# Patient Record
Sex: Male | Born: 1960 | Race: Asian | Hispanic: No | Marital: Married | Smoking: Never smoker
Health system: Southern US, Community
[De-identification: ages and names within clinical notes are randomized; demographics above are authoritative.]

## PROBLEM LIST (undated history)

## (undated) ENCOUNTER — Emergency Department (HOSPITAL_COMMUNITY): Admission: EM | Payer: No Typology Code available for payment source | Source: Home / Self Care

## (undated) DIAGNOSIS — M4800 Spinal stenosis, site unspecified: Secondary | ICD-10-CM

## (undated) DIAGNOSIS — M549 Dorsalgia, unspecified: Secondary | ICD-10-CM

## (undated) DIAGNOSIS — Z87442 Personal history of urinary calculi: Secondary | ICD-10-CM

## (undated) HISTORY — PX: BACK SURGERY: SHX140

## (undated) HISTORY — PX: WRIST FRACTURE SURGERY: SHX121

---

## 2009-06-09 ENCOUNTER — Encounter: Admission: RE | Admit: 2009-06-09 | Discharge: 2009-07-14 | Payer: Self-pay | Admitting: Orthopedic Surgery

## 2009-08-23 ENCOUNTER — Inpatient Hospital Stay (HOSPITAL_COMMUNITY): Admission: RE | Admit: 2009-08-23 | Discharge: 2009-08-24 | Payer: Self-pay | Admitting: Orthopedic Surgery

## 2009-09-12 ENCOUNTER — Encounter
Admission: RE | Admit: 2009-09-12 | Discharge: 2009-10-31 | Payer: Self-pay | Source: Home / Self Care | Admitting: Orthopedic Surgery

## 2010-04-23 LAB — CBC
HCT: 46.4 % (ref 39.0–52.0)
MCH: 29.6 pg (ref 26.0–34.0)
MCV: 86.6 fL (ref 78.0–100.0)
Platelets: 170 10*3/uL (ref 150–400)
RBC: 5.36 MIL/uL (ref 4.22–5.81)

## 2010-04-23 LAB — URINALYSIS, ROUTINE W REFLEX MICROSCOPIC
Glucose, UA: NEGATIVE mg/dL
Ketones, ur: NEGATIVE mg/dL
Protein, ur: 30 mg/dL — AB
Urobilinogen, UA: 0.2 mg/dL (ref 0.0–1.0)
pH: 6 (ref 5.0–8.0)

## 2010-04-23 LAB — SURGICAL PCR SCREEN
MRSA, PCR: NEGATIVE
Staphylococcus aureus: NEGATIVE

## 2010-04-23 LAB — COMPREHENSIVE METABOLIC PANEL
ALT: 28 U/L (ref 0–53)
AST: 27 U/L (ref 0–37)
Alkaline Phosphatase: 99 U/L (ref 39–117)
CO2: 26 mEq/L (ref 19–32)
Chloride: 110 mEq/L (ref 96–112)
GFR calc non Af Amer: >60 mL/min (ref >60)
Glucose, Bld: 108 mg/dL — ABNORMAL HIGH (ref 70–99)
Potassium: 3.5 mEq/L (ref 3.5–5.1)
Total Bilirubin: 0.5 mg/dL (ref 0.3–1.2)

## 2010-04-23 LAB — DIFFERENTIAL
Basophils Relative: 1 % (ref 0–1)
Eosinophils Relative: 2 % (ref 0–5)
Monocytes Absolute: 0.3 10*3/uL (ref 0.1–1.0)
Neutro Abs: 2.6 10*3/uL (ref 1.7–7.7)
Neutrophils Relative %: 57 % (ref 43–77)

## 2010-04-23 LAB — APTT: aPTT: 29 seconds (ref 24–37)

## 2010-04-23 LAB — TYPE AND SCREEN
ABO/RH(D): B POS
Antibody Screen: NEGATIVE

## 2010-04-23 LAB — URINE MICROSCOPIC-ADD ON

## 2013-07-15 ENCOUNTER — Other Ambulatory Visit: Payer: Self-pay | Admitting: Orthopedic Surgery

## 2013-07-15 DIAGNOSIS — M48061 Spinal stenosis, lumbar region without neurogenic claudication: Secondary | ICD-10-CM

## 2013-07-20 ENCOUNTER — Other Ambulatory Visit: Payer: Self-pay | Admitting: Orthopedic Surgery

## 2013-07-20 ENCOUNTER — Ambulatory Visit
Admission: RE | Admit: 2013-07-20 | Discharge: 2013-07-20 | Disposition: A | Discharge status: Home / Self Care | Payer: No Typology Code available for payment source | Source: Ambulatory Visit | Attending: Orthopedic Surgery | Admitting: Orthopedic Surgery

## 2013-07-20 ENCOUNTER — Inpatient Hospital Stay
Admission: RE | Admit: 2013-07-20 | Discharge: 2013-07-20 | Disposition: A | Discharge status: Home / Self Care | Payer: Self-pay | Source: Ambulatory Visit | Attending: Orthopedic Surgery | Admitting: Orthopedic Surgery

## 2013-07-20 VITALS — BP 127/73 | HR 55

## 2013-07-20 DIAGNOSIS — M48061 Spinal stenosis, lumbar region without neurogenic claudication: Secondary | ICD-10-CM

## 2013-07-20 MED ORDER — DIAZEPAM 5 MG PO TABS
5.0000 mg | ORAL_TABLET | Freq: Once | ORAL | Status: AC
  Administered 2013-07-20: 5 mg via ORAL

## 2013-07-20 MED ORDER — MEPERIDINE HCL 100 MG/ML IJ SOLN
50.0000 mg | Freq: Once | INTRAMUSCULAR | Status: AC
  Administered 2013-07-20: 50 mg via INTRAMUSCULAR

## 2013-07-20 MED ORDER — IOHEXOL 180 MG/ML  SOLN
17.0000 mL | Freq: Once | INTRAMUSCULAR | Status: AC | PRN
  Administered 2013-07-20: 17 mL via INTRATHECAL

## 2013-07-20 MED ORDER — ONDANSETRON HCL 4 MG/2ML IJ SOLN
4.0000 mg | Freq: Once | INTRAMUSCULAR | Status: AC
  Administered 2013-07-20: 4 mg via INTRAMUSCULAR

## 2013-07-20 NOTE — Discharge Instructions (Signed)

## 2013-07-23 ENCOUNTER — Other Ambulatory Visit: Payer: Self-pay | Admitting: Urology

## 2013-07-24 ENCOUNTER — Encounter (HOSPITAL_COMMUNITY)
Admission: RE | Admit: 2013-07-24 | Discharge: 2013-07-24 | Disposition: A | Discharge status: Home / Self Care | Payer: No Typology Code available for payment source | Source: Ambulatory Visit | Attending: Urology | Admitting: Urology

## 2013-07-24 ENCOUNTER — Encounter (HOSPITAL_COMMUNITY): Payer: Self-pay

## 2013-07-24 ENCOUNTER — Encounter (HOSPITAL_COMMUNITY): Payer: Self-pay | Admitting: Pharmacy Technician

## 2013-07-24 DIAGNOSIS — Z01812 Encounter for preprocedural laboratory examination: Secondary | ICD-10-CM | POA: Insufficient documentation

## 2013-07-24 HISTORY — DX: Dorsalgia, unspecified: M54.9

## 2013-07-24 HISTORY — DX: Personal history of urinary calculi: Z87.442

## 2013-07-24 LAB — BASIC METABOLIC PANEL
BUN: 15 mg/dL (ref 6–23)
CO2: 23 meq/L (ref 19–32)
Calcium: 9.4 mg/dL (ref 8.4–10.5)
Chloride: 103 mEq/L (ref 96–112)
Creatinine, Ser: 1.12 mg/dL (ref 0.50–1.35)
GFR calc Af Amer: 86 mL/min — ABNORMAL LOW (ref >90)
GFR calc non Af Amer: 74 mL/min — ABNORMAL LOW (ref >90)
Glucose, Bld: 181 mg/dL — ABNORMAL HIGH (ref 70–99)
POTASSIUM: 4.1 meq/L (ref 3.7–5.3)
Sodium: 140 mEq/L (ref 137–147)

## 2013-07-24 LAB — CBC
HCT: 47.8 % (ref 39.0–52.0)
Hemoglobin: 16.4 g/dL (ref 13.0–17.0)
MCH: 28.5 pg (ref 26.0–34.0)
MCHC: 34.3 g/dL (ref 30.0–36.0)
MCV: 83 fL (ref 78.0–100.0)
PLATELETS: 196 10*3/uL (ref 150–400)
RBC: 5.76 MIL/uL (ref 4.22–5.81)
RDW: 12.8 % (ref 11.5–15.5)
WBC: 4.9 10*3/uL (ref 4.0–10.5)

## 2013-07-24 NOTE — Pre-Procedure Instructions (Addendum)
07-24-13 CT lumbar spine 07-20-13 Epic. Pt. Signed for daughter Juluis Rainierhao [place] to act as interpreter.

## 2013-07-24 NOTE — Patient Instructions (Signed)
20 Gregory Dickson  07/24/2013   Your procedure is scheduled on:   07-29-2013  [facility] and follow signs to Short Stay Center. Arrive at    0900    AM / PM.  Call this number if you have problems the morning of surgery: [phone]  Or Presurgical Testing [phone]([place]) For Living Will and/or Health Care Power Attorney Forms: please provide copy for your medical record,may bring AM of surgery(Forms should be already notarized -we do not provide this service). 07-24-13 No information preferred today).  Remember: Follow any bowel prep instructions per MD office.   Do not eat food:After Midnight.    Take these medicines the morning of surgery with A SIP OF WATER: NONE.   Do not wear jewelry, make-up or nail polish.  Do not wear lotions, powders, or perfumes. You may wear deodorant.  Do not shave 48 hours(2 days) prior to first CHG shower(legs and under arms).(Shaving face and neck okay.)  Do not bring valuables to the hospital.(Hospital is not responsible for lost valuables).  Contacts, dentures or removable bridgework, body piercing, hair pins may not be worn into surgery.  Leave suitcase in the car. After surgery it may be brought to your room.  For patients admitted to the hospital, checkout time is 11:00 AM the day of discharge.(Restricted visitors-Any Persons displaying flu-like symptoms or illness).    Patients discharged the day of surgery will not be allowed to drive home. Must have responsible person with you x 24 hours once discharged.  Name and phone number of your driver: Juluis Rainierhao Chenette, daughter to interpret -[phone] cell  Special Instructions: CHG(Chlorhedine 4%-"Hibiclens","Betasept","Aplicare") Shower Use Special Wash: see special instructions.(avoid face and genitals)      ________________________    [facility] - Preparing for [facility] Before surgery, you can play an important role.  Because skin is not sterile, your skin needs to be as  free of germs as possible.  You can reduce the number of germs on your skin by washing with CHG (chlorahexidine gluconate) soap before surgery.  CHG is an antiseptic cleaner which kills germs and bonds with the skin to continue killing germs even after washing. Please DO NOT use if you have an allergy to CHG or antibacterial soaps.  If your skin becomes reddened/irritated stop using the CHG and inform your nurse when you arrive at Short Stay. Do not shave (including legs and underarms) for at least 48 hours prior to the first CHG shower.  You may shave your face/neck. Please follow these instructions carefully:  1.  Shower with CHG Soap the night before surgery and the  morning of Surgery.  2.  If you choose to wash your hair, wash your hair first as usual with your  normal  shampoo.  3.  After you shampoo, rinse your hair and body thoroughly to remove the  shampoo.                           4.  Use CHG as you would any other liquid soap.  You can apply chg directly  to the skin and wash                       Gently with a scrungie or clean washcloth.  5.  Apply the CHG Soap to your body ONLY FROM THE NECK DOWN.   Do not use on face/ open  Wound or open sores. Avoid contact with eyes, ears mouth and genitals (private parts).                       Wash face,  Genitals (private parts) with your normal soap.             6.  Wash thoroughly, paying special attention to the area where your surgery  will be performed.  7.  Thoroughly rinse your body with warm water from the neck down.  8.  DO NOT shower/wash with your normal soap after using and rinsing off  the CHG Soap.                9.  Pat yourself dry with a clean towel.            10.  Wear clean pajamas.            11.  Place clean sheets on your bed the night of your first shower and do not  sleep with pets. Day of Surgery : Do not apply any lotions/deodorants the morning of surgery.  Please wear clean clothes to the  hospital/surgery center.  FAILURE TO FOLLOW THESE INSTRUCTIONS MAY RESULT IN THE CANCELLATION OF YOUR SURGERY PATIENT SIGNATURE_________________________________  NURSE SIGNATURE__________________________________  ________________________________________________________________________

## 2013-07-29 ENCOUNTER — Ambulatory Visit (HOSPITAL_COMMUNITY): Payer: No Typology Code available for payment source | Admitting: Certified Registered Nurse Anesthetist

## 2013-07-29 ENCOUNTER — Encounter (HOSPITAL_COMMUNITY): Payer: Self-pay | Admitting: *Deleted

## 2013-07-29 ENCOUNTER — Other Ambulatory Visit: Payer: Self-pay | Admitting: Urology

## 2013-07-29 ENCOUNTER — Ambulatory Visit (HOSPITAL_COMMUNITY)
Admission: RE | Admit: 2013-07-29 | Discharge: 2013-07-29 | Disposition: A | Discharge status: Home / Self Care | Payer: No Typology Code available for payment source | Source: Ambulatory Visit | Attending: Urology | Admitting: Urology

## 2013-07-29 ENCOUNTER — Ambulatory Visit (HOSPITAL_COMMUNITY): Payer: No Typology Code available for payment source

## 2013-07-29 ENCOUNTER — Encounter (HOSPITAL_COMMUNITY): Payer: No Typology Code available for payment source | Admitting: Certified Registered Nurse Anesthetist

## 2013-07-29 ENCOUNTER — Encounter (HOSPITAL_COMMUNITY)
Admission: RE | Disposition: A | Discharge status: Home / Self Care | Payer: Self-pay | Source: Ambulatory Visit | Attending: Urology

## 2013-07-29 DIAGNOSIS — N135 Crossing vessel and stricture of ureter without hydronephrosis: Secondary | ICD-10-CM | POA: Insufficient documentation

## 2013-07-29 DIAGNOSIS — Z79899 Other long term (current) drug therapy: Secondary | ICD-10-CM | POA: Insufficient documentation

## 2013-07-29 DIAGNOSIS — N201 Calculus of ureter: Secondary | ICD-10-CM

## 2013-07-29 HISTORY — PX: CYSTOSCOPY WITH RETROGRADE PYELOGRAM, URETEROSCOPY AND STENT PLACEMENT: SHX5789

## 2013-07-29 SURGERY — CYSTOURETEROSCOPY, WITH RETROGRADE PYELOGRAM AND STENT INSERTION
Anesthesia: General | Laterality: Right

## 2013-07-29 MED ORDER — PHENAZOPYRIDINE HCL 200 MG PO TABS
200.0000 mg | ORAL_TABLET | Freq: Once | ORAL | Status: AC
Start: 2013-07-29 — End: 2013-07-29
  Administered 2013-07-29: 200 mg via ORAL
  Filled 2013-07-29: qty 1

## 2013-07-29 MED ORDER — BELLADONNA ALKALOIDS-OPIUM 16.2-60 MG RE SUPP
RECTAL | Status: DC | PRN
  Administered 2013-07-29: 1 via RECTAL

## 2013-07-29 MED ORDER — SENNOSIDES-DOCUSATE SODIUM 8.6-50 MG PO TABS: 1.0000 | ORAL_TABLET | Freq: Two times a day (BID) | ORAL | Status: DC

## 2013-07-29 MED ORDER — PHENAZOPYRIDINE HCL 200 MG PO TABS: 200.0000 mg | ORAL_TABLET | Freq: Three times a day (TID) | ORAL | Status: DC | PRN

## 2013-07-29 MED ORDER — ONDANSETRON HCL 4 MG/2ML IJ SOLN
INTRAMUSCULAR | Status: AC
  Filled 2013-07-29: qty 2

## 2013-07-29 MED ORDER — OXYCODONE-ACETAMINOPHEN 5-325 MG PO TABS: 1.0000 | ORAL_TABLET | ORAL | Status: DC | PRN

## 2013-07-29 MED ORDER — LIDOCAINE HCL (CARDIAC) 20 MG/ML IV SOLN
INTRAVENOUS | Status: DC | PRN
  Administered 2013-07-29: 100 mg via INTRAVENOUS

## 2013-07-29 MED ORDER — LIDOCAINE HCL 2 % EX GEL
CUTANEOUS | Status: AC
  Filled 2013-07-29: qty 10

## 2013-07-29 MED ORDER — PROMETHAZINE HCL 25 MG/ML IJ SOLN: 6.2500 mg | INTRAMUSCULAR | Status: DC | PRN

## 2013-07-29 MED ORDER — FENTANYL CITRATE 0.05 MG/ML IJ SOLN
INTRAMUSCULAR | Status: AC
  Filled 2013-07-29: qty 2

## 2013-07-29 MED ORDER — ONDANSETRON HCL 4 MG/2ML IJ SOLN
INTRAMUSCULAR | Status: DC | PRN
  Administered 2013-07-29: 4 mg via INTRAVENOUS

## 2013-07-29 MED ORDER — LIDOCAINE HCL 2 % EX GEL
CUTANEOUS | Status: DC | PRN
  Administered 2013-07-29: 1 via URETHRAL

## 2013-07-29 MED ORDER — IOHEXOL 350 MG/ML SOLN
INTRAVENOUS | Status: DC | PRN
  Administered 2013-07-29: 24 mL via URETHRAL

## 2013-07-29 MED ORDER — FENTANYL CITRATE 0.05 MG/ML IJ SOLN
25.0000 ug | INTRAMUSCULAR | Status: DC | PRN
  Administered 2013-07-29 (×2): 50 ug via INTRAVENOUS

## 2013-07-29 MED ORDER — CIPROFLOXACIN HCL 500 MG PO TABS: 500.0000 mg | ORAL_TABLET | Freq: Two times a day (BID) | ORAL | Status: DC

## 2013-07-29 MED ORDER — LACTATED RINGERS IV SOLN
INTRAVENOUS | Status: DC
  Administered 2013-07-29: 13:00:00 via INTRAVENOUS
  Administered 2013-07-29: 1000 mL via INTRAVENOUS

## 2013-07-29 MED ORDER — MIDAZOLAM HCL 2 MG/2ML IJ SOLN
INTRAMUSCULAR | Status: AC
  Filled 2013-07-29: qty 2

## 2013-07-29 MED ORDER — SODIUM CHLORIDE 0.9 % IR SOLN
Status: DC | PRN
  Administered 2013-07-29: 4000 mL

## 2013-07-29 MED ORDER — BELLADONNA ALKALOIDS-OPIUM 16.2-60 MG RE SUPP
RECTAL | Status: AC
  Filled 2013-07-29: qty 1

## 2013-07-29 MED ORDER — MIDAZOLAM HCL 5 MG/5ML IJ SOLN
INTRAMUSCULAR | Status: DC | PRN
  Administered 2013-07-29: 2 mg via INTRAVENOUS

## 2013-07-29 MED ORDER — PROPOFOL 10 MG/ML IV BOLUS
INTRAVENOUS | Status: AC
  Filled 2013-07-29: qty 20

## 2013-07-29 MED ORDER — PROPOFOL 10 MG/ML IV BOLUS
INTRAVENOUS | Status: DC | PRN
  Administered 2013-07-29: 50 mg via INTRAVENOUS
  Administered 2013-07-29: 150 mg via INTRAVENOUS

## 2013-07-29 MED ORDER — CEFAZOLIN SODIUM-DEXTROSE 2-3 GM-% IV SOLR
2.0000 g | INTRAVENOUS | Status: AC
  Administered 2013-07-29: 2 g via INTRAVENOUS

## 2013-07-29 MED ORDER — TAMSULOSIN HCL 0.4 MG PO CAPS: 0.4000 mg | ORAL_CAPSULE | Freq: Every day | ORAL | Status: DC

## 2013-07-29 MED ORDER — FENTANYL CITRATE 0.05 MG/ML IJ SOLN
INTRAMUSCULAR | Status: DC | PRN
  Administered 2013-07-29 (×2): 25 ug via INTRAVENOUS

## 2013-07-29 MED ORDER — CEFAZOLIN SODIUM-DEXTROSE 2-3 GM-% IV SOLR
INTRAVENOUS | Status: AC
  Filled 2013-07-29: qty 50

## 2013-07-29 MED ORDER — LIDOCAINE HCL (CARDIAC) 20 MG/ML IV SOLN
INTRAVENOUS | Status: AC
  Filled 2013-07-29: qty 5

## 2013-07-29 SURGICAL SUPPLY — 29 items
BAG URO CATCHER STRL LF (DRAPE) ×3 IMPLANT
BASKET LASER NITINOL 1.9FR (BASKET) IMPLANT
BASKET STNLS GEMINI 4WIRE 3FR (BASKET) IMPLANT
BASKET ZERO TIP NITINOL 2.4FR (BASKET) IMPLANT
CATH CLEAR GEL 3F BACKSTOP (CATHETERS) IMPLANT
CATH URET 5FR 28IN CONE TIP (BALLOONS)
CATH URET 5FR 28IN OPEN ENDED (CATHETERS) ×3 IMPLANT
CATH URET 5FR 70CM CONE TIP (BALLOONS) IMPLANT
CATH URET DUAL LUMEN 6-10FR 50 (CATHETERS) ×3 IMPLANT
CLOTH BEACON ORANGE TIMEOUT ST (SAFETY) ×3 IMPLANT
DRAPE CAMERA CLOSED 9X96 (DRAPES) ×3 IMPLANT
FIBER LASER FLEXIVA 200 (UROLOGICAL SUPPLIES) IMPLANT
FIBER LASER FLEXIVA 365 (UROLOGICAL SUPPLIES) IMPLANT
GLOVE BIOGEL M 7.0 STRL (GLOVE) ×9 IMPLANT
GOWN STRL REUS W/TWL LRG LVL3 (GOWN DISPOSABLE) ×6 IMPLANT
GOWN STRL REUS W/TWL XL LVL3 (GOWN DISPOSABLE) IMPLANT
GUIDEWIRE ANG ZIPWIRE 038X150 (WIRE) IMPLANT
GUIDEWIRE STR DUAL SENSOR (WIRE) ×3 IMPLANT
IV NS IRRIG 3000ML ARTHROMATIC (IV SOLUTION) ×3 IMPLANT
MANIFOLD NEPTUNE II (INSTRUMENTS) ×3 IMPLANT
PACK CYSTO (CUSTOM PROCEDURE TRAY) ×3 IMPLANT
SCRUB PCMX 4 OZ (MISCELLANEOUS) ×3 IMPLANT
SHEATH ACCESS URETERAL 38CM (SHEATH) IMPLANT
SHEATH URET ACCESS 12FR/35CM (UROLOGICAL SUPPLIES) IMPLANT
SHEATH URET ACCESS 12FR/55CM (UROLOGICAL SUPPLIES) IMPLANT
STENT CONTOUR 6FRX24X.038 (STENTS) ×3 IMPLANT
SYRINGE IRR TOOMEY STRL 70CC (SYRINGE) IMPLANT
TUBING CONNECTING 10 (TUBING) ×2 IMPLANT
TUBING CONNECTING 10' (TUBING) ×1

## 2013-07-29 NOTE — Anesthesia Preprocedure Evaluation (Signed)
Anesthesia Evaluation  Patient identified by MRN, date of birth, ID band Patient awake    Reviewed: Allergy & Precautions, H&P , NPO status , Patient's Chart, lab work & pertinent test results  Airway Mallampati: II TM Distance: >3 FB Neck ROM: Full    Dental no notable dental hx.    Pulmonary neg pulmonary ROS,  breath sounds clear to auscultation  Pulmonary exam normal       Cardiovascular Exercise Tolerance: Good negative cardio ROS  Rhythm:Regular Rate:Normal     Neuro/Psych negative neurological ROS  negative psych ROS   GI/Hepatic negative GI ROS, Neg liver ROS,   Endo/Other  negative endocrine ROS  Renal/GU negative Renal ROS  negative genitourinary   Musculoskeletal negative musculoskeletal ROS (+)   Abdominal   Peds negative pediatric ROS (+)  Hematology negative hematology ROS (+)   Anesthesia Other Findings   Reproductive/Obstetrics negative OB ROS                           Anesthesia Physical Anesthesia Plan  ASA: I  Anesthesia Plan: General   Post-op Pain Management:    Induction: Intravenous  Airway Management Planned: LMA  Additional Equipment:   Intra-op Plan:   Post-operative Plan: Extubation in OR  Informed Consent: I have reviewed the patients History and Physical, chart, labs and discussed the procedure including the risks, benefits and alternatives for the proposed anesthesia with the patient or authorized representative who has indicated his/her understanding and acceptance.   Dental advisory given  Plan Discussed with: CRNA  Anesthesia Plan Comments:         Anesthesia Quick Evaluation  

## 2013-07-29 NOTE — Transfer of Care (Signed)
Immediate Anesthesia Transfer of Care Note  Patient: Gregory Dickson  Procedure(s) Performed: Procedure(s) (LRB): CYSTOSCOPY WITH RETROGRADE PYELOGRAM, URETEROSCOPY AND STENT PLACEMENT (Right)  Patient Location: PACU  Anesthesia Type: General  Level of Consciousness: sedated, patient cooperative and responds to stimulation  Airway & Oxygen Therapy: Patient Spontanous Breathing and Patient connected to face mask oxgen  Post-op Assessment: Report given to PACU RN and Post -op Vital signs reviewed and stable  Post vital signs: Reviewed and stable  Complications: No apparent anesthesia complications

## 2013-07-29 NOTE — Discharge Instructions (Signed)
DISCHARGE INSTRUCTIONS FOR KIDNEY STONES OR URETERAL STENT  MEDICATIONS:   1. Resume all your other meds from home - except do not take NSAIDS (naproxyn).  ACTIVITY 1. No strenuous activity x 1week 2. No driving while on narcotic pain medications 3. Drink plenty of water 4. Continue to walk at home - you can still get blood clots when you are at home, so keep active, but don't over do it. 5. May return to work in 3 days.  BATHING 1. You can shower or take a bath.     SIGNS/SYMPTOMS TO CALL: 1. Please call us if you have a fever greater than 101.5, uncontrolled  nausea/vomiting, uncontrolled pain, dizziness, unable to urinate, chest pain, shortness of breath, leg swelling, leg pain, redness around wound, drainage from wound, or any other concerns or questions.  You can reach us at [phone].

## 2013-07-29 NOTE — Anesthesia Postprocedure Evaluation (Signed)
  Anesthesia Post-op Note  Patient: Gregory Dickson  Procedure(s) Performed: Procedure(s) (LRB): CYSTOSCOPY WITH RETROGRADE PYELOGRAM, URETEROSCOPY AND STENT PLACEMENT (Right)  Patient Location: PACU  Anesthesia Type: General  Level of Consciousness: awake and alert   Airway and Oxygen Therapy: Patient Spontanous Breathing  Post-op Pain: mild  Post-op Assessment: Post-op Vital signs reviewed, Patient's Cardiovascular Status Stable, Respiratory Function Stable, Patent Airway and No signs of Nausea or vomiting  Last Vitals:  Filed Vitals:   07/29/13 1442  BP: 120/82  Pulse: 58  Temp: 36.6 C  Resp: 14    Post-op Vital Signs: stable   Complications: No apparent anesthesia complications

## 2013-07-29 NOTE — H&P (Signed)
Urology History and Physical Exam  CC: Right ureter stone.  HPI:  53 year old male presents today for right ureter stone.  This was discovered during workup for gross hematuria.  Workup revealed no malignancy.  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  It is unknown whether obstruction from his right ureter stone resulted in this or whether this was present before the stone developed.  This appears to be a long-standing issue due to the amount of atrophy.  There is no associated right-sided hydronephrosis.  The stone is located in the distal ureter.  It is 8 mm in size.  It is not associated with flank pain, but is associated with gross hematuria.  We have discussed management options including observation, but the patient would prefer to have intervention.  He presents today for cystoscopy, right ureteroscopy, laser lithotripsy, right retrograde program, and possible right ureter stent placement.  We discussed risks, benefits, alternatives, and likelihood of achieving goals.  Urine culture 07/24/13 was negative for growth.  PMH: Past Medical History  Diagnosis Date  . History of kidney stones     multiple  . Back pain     past hx. back surgery, recent Xray done 07-20-13    PSH: Past Surgical History  Procedure Laterality Date  . Back surgery      Lumbar -6 yrs ago  . Cystoscopy w/ ureteral stent placement    . Wrist fracture surgery      retained hardware    Allergies: No Known Allergies  Medications: Prescriptions prior to admission  Medication Sig Dispense Refill  . naproxen sodium (ANAPROX) 220 MG tablet Take 220 mg by mouth 2 (two) times daily with a meal.         Social History: History   Social History  . Marital Status: Married    Spouse Name: N/A    Number of Children: N/A  . Years of Education: N/A   Occupational History  . Not on file.   Social History Main Topics  . Smoking status: Never Smoker   . Smokeless tobacco: Never Used  . Alcohol Use:  No  . Drug Use: No  . Sexual Activity: Yes   Other Topics Concern  . Not on file   Social History Narrative  . No narrative on file    Family History: No family history on file.  Review of Systems: Positive: None (no gross hematuria since our last visit). Negative: Fever, SOB, or chest pain.  A further 10 point review of systems was negative except what is listed in the HPI.  Physical Exam: Filed Vitals:   07/29/13 0835  BP: 140/97  Pulse: 64  Temp: 97.5 F (36.4 C)  Resp: 18    General: No acute distress.  Awake. Head:  Normocephalic.  Atraumatic. ENT:  EOMI.  Mucous membranes moist Neck:  Supple.  No lymphadenopathy. CV:  S1 present. S2 present. Regular rate. Pulmonary: Equal effort bilaterally.  Clear to auscultation bilaterally. Abdomen: Soft.  Non- tender to palpation. Skin:  Normal turgor.  No visible rash. Extremity: No gross deformity of bilateral upper extremities.  No gross deformity of    bilateral lower extremities. Neurologic: Alert. Appropriate mood.  .  Studies:  No results found for this basename: HGB, WBC, PLT,  in the last 72 hours  No results found for this basename: NA, K, CL, CO2, BUN, CREATININE, CALCIUM, MAGNESIUM, GFRNONAA, GFRAA,  in the last 72 hours   No results found for this basename: PT,  INR, APTT,  in the last 72 hours   No components found with this basename: ABG,     Assessment:  Right ureter stone.  Plan: To OR  for cystoscopy, right ureteroscopy, laser lithotripsy, right retrograde program, and possible right ureter stent placement.

## 2013-07-29 NOTE — Op Note (Signed)
Urology Operative Report  Date of Procedure: 07/29/13  Surgeon: Rolan Bucco, MD Assistant:  None  Preoperative Diagnosis: Right ureter stone. Postoperative Diagnosis: Right ureter stone. Right distal ureter stenosis.  Procedure(s): Right ureteroscopy, diagnostic. Right retrograde pyelogram with interpretation. Right ureter stent placement (6 x 24 JJ, no tether). Cystoscopy.  Estimated blood loss: Minimal  Specimen: None  Drains: None  Complications: None  Findings: Filling defect in the distal ureter consistent with a stone. Extravasation from the proximal ureter with no instrumentation to explain why this would occur. Negative bladder tumors.  History of present illness: 53 year old male presents today for a right distal ureter stone. This was discovered during workup for gross hematuria. It was associated with an atrophic right kidney and therefore has likely been present for a long period of time. He elected to proceed with cystoscopy and ureteroscopy today understanding that this may require a staged ureteroscopy due to the duration the stone is been in place.   Procedure in detail: After informed consent was obtained, the patient was taken to the operating room. They were placed in the supine position. SCDs were turned on and in place. IV antibiotics were infused, and general anesthesia was induced. A timeout was performed in which the correct patient, surgical site, and procedure were identified and agreed upon by the team.  The patient was placed in a dorsolithotomy position, making sure to pad all pertinent neurovascular pressure points. The genitals were prepped and draped in the usual sterile fashion.  A rigid cystoscope was advanced through the urethra and into the bladder. The bladder was fully distended after was drained and evaluated in a systematic fashion to visualize the entire surface of the bladder. This was negative for bladder tumors.  Attention was  turned to the right ureter orifice. Due to the angle it could not be cannulated with a 5 French catheter and therefore I elected to try to place a sensor wire. This was not successful due to the ankle. I then placed an angle-tipped Glidewire which was able to be placed into the distal ureter. It met an obstruction in the distal ureter and with some manipulation was able to navigate this into the proximal ureter. I then passed a 5 [place] ureter catheter over this and removed the wire.  I obtained a right retrograde pyelogram by injecting 10 cc of Omnipaque through the 5 French ureter catheter. This revealed a filling defect in the left distal ureter and poor flow of contrast up the ureter itself. I then placed a sensor wire through the 5 French catheter up into the right renal pelvis. I attempted to place a 5 [place] ureter catheter over this without much success. I therefore left the wire place and placed a dual-lumen ureter access sheath over the wire into the distal ureter and injected 10 more cc of Omnipaque. This revealed a filling defect as described above and extravasation from the proximal ureter with good filling of the right renal pelvis. There was blunting of the calyces. This I did not drain well.  I do not have a good explanation why there were be extravasation of contrast in the mid ureter.  Secured to the safety wire to drape and pass a semirigid ureteroscope the urethra, and the bladder, and into the distal ureter. I encountered an area of stenosis beyond this I could see what appeared to be a stent. I removed the semirigid ureteroscope and attempted to place the dual-lumen ureter access sheath over the wire to dilate this  area, but this was not successful. I therefore elected to place a stent and return for a staged right ureteroscopy.  A 6 x 24 double-J stent was passed through the cystoscope, over the safety wire, and under fluoroscopy. This was difficult to pass beyond the distal stone.  This was deployed with the stent curling in the renal pelvis and a good curl in the bladder. There was return of contrast and evidence of drainage of contrast from the renal pelvis on fluoroscopy. The bladder was drained, the cystoscope was removed, I placed 10 cc of lidocaine jelly into the urethra, and a belladonna and opium suppository into the rectum.  Anesthesia was reversed after he's placed back in a supine position and he was taken to the [facility] in stable condition.  He'll be given Cipro to cover for 3 days now and to start 3 days prior to his staged right ureteroscopy.  All counts were correct at the end of the case.

## 2013-07-30 ENCOUNTER — Encounter (HOSPITAL_COMMUNITY): Payer: Self-pay | Admitting: Urology

## 2013-08-06 ENCOUNTER — Encounter (HOSPITAL_BASED_OUTPATIENT_CLINIC_OR_DEPARTMENT_OTHER): Payer: Self-pay | Admitting: *Deleted

## 2013-08-06 NOTE — Progress Notes (Signed)
Spoke with daughter Gweneth Fritterhao-will come on day of surgery to help interpret.Instructed to arrive at 0830-Npo after Mn-labs in epic.

## 2013-08-12 ENCOUNTER — Encounter (HOSPITAL_BASED_OUTPATIENT_CLINIC_OR_DEPARTMENT_OTHER)
Admission: RE | Disposition: A | Discharge status: Home / Self Care | Payer: Self-pay | Source: Ambulatory Visit | Attending: Urology

## 2013-08-12 ENCOUNTER — Ambulatory Visit (HOSPITAL_BASED_OUTPATIENT_CLINIC_OR_DEPARTMENT_OTHER)
Admission: RE | Admit: 2013-08-12 | Discharge: 2013-08-12 | Disposition: A | Discharge status: Home / Self Care | Payer: No Typology Code available for payment source | Source: Ambulatory Visit | Attending: Urology | Admitting: Urology

## 2013-08-12 ENCOUNTER — Encounter (HOSPITAL_BASED_OUTPATIENT_CLINIC_OR_DEPARTMENT_OTHER): Payer: No Typology Code available for payment source | Admitting: Anesthesiology

## 2013-08-12 ENCOUNTER — Encounter (HOSPITAL_BASED_OUTPATIENT_CLINIC_OR_DEPARTMENT_OTHER): Payer: Self-pay | Admitting: Anesthesiology

## 2013-08-12 ENCOUNTER — Ambulatory Visit (HOSPITAL_BASED_OUTPATIENT_CLINIC_OR_DEPARTMENT_OTHER): Payer: No Typology Code available for payment source | Admitting: Anesthesiology

## 2013-08-12 DIAGNOSIS — N201 Calculus of ureter: Secondary | ICD-10-CM | POA: Insufficient documentation

## 2013-08-12 DIAGNOSIS — Z79899 Other long term (current) drug therapy: Secondary | ICD-10-CM | POA: Insufficient documentation

## 2013-08-12 DIAGNOSIS — Z87442 Personal history of urinary calculi: Secondary | ICD-10-CM | POA: Insufficient documentation

## 2013-08-12 HISTORY — PX: HOLMIUM LASER APPLICATION: SHX5852

## 2013-08-12 HISTORY — PX: CYSTOSCOPY WITH RETROGRADE PYELOGRAM, URETEROSCOPY AND STENT PLACEMENT: SHX5789

## 2013-08-12 LAB — POCT I-STAT 4, (NA,K, GLUC, HGB,HCT)
GLUCOSE: 106 mg/dL — AB (ref 70–99)
HEMATOCRIT: 46 % (ref 39.0–52.0)
HEMOGLOBIN: 15.6 g/dL (ref 13.0–17.0)
Potassium: 4.1 mEq/L (ref 3.7–5.3)
Sodium: 140 mEq/L (ref 137–147)

## 2013-08-12 SURGERY — CYSTOURETEROSCOPY, WITH RETROGRADE PYELOGRAM AND STENT INSERTION
Anesthesia: General | Site: Ureter | Laterality: Right

## 2013-08-12 MED ORDER — PROPOFOL 10 MG/ML IV BOLUS
INTRAVENOUS | Status: DC | PRN
  Administered 2013-08-12: 200 mg via INTRAVENOUS

## 2013-08-12 MED ORDER — FENTANYL CITRATE 0.05 MG/ML IJ SOLN
INTRAMUSCULAR | Status: AC
  Filled 2013-08-12: qty 2

## 2013-08-12 MED ORDER — KETOROLAC TROMETHAMINE 30 MG/ML IJ SOLN
INTRAMUSCULAR | Status: DC | PRN
  Administered 2013-08-12: 30 mg via INTRAVENOUS

## 2013-08-12 MED ORDER — BELLADONNA ALKALOIDS-OPIUM 16.2-60 MG RE SUPP
RECTAL | Status: AC
  Filled 2013-08-12: qty 1

## 2013-08-12 MED ORDER — LACTATED RINGERS IV SOLN
INTRAVENOUS | Status: DC | PRN
  Administered 2013-08-12 (×2): via INTRAVENOUS

## 2013-08-12 MED ORDER — LACTATED RINGERS IV SOLN
INTRAVENOUS | Status: DC
  Administered 2013-08-12 (×2): via INTRAVENOUS
  Filled 2013-08-12: qty 1000

## 2013-08-12 MED ORDER — MIDAZOLAM HCL 5 MG/5ML IJ SOLN
INTRAMUSCULAR | Status: DC | PRN
  Administered 2013-08-12 (×2): 1 mg via INTRAVENOUS

## 2013-08-12 MED ORDER — SODIUM CHLORIDE 0.9 % IR SOLN
Status: DC | PRN
  Administered 2013-08-12: 4000 mL

## 2013-08-12 MED ORDER — FENTANYL CITRATE 0.05 MG/ML IJ SOLN
25.0000 ug | INTRAMUSCULAR | Status: DC | PRN
  Administered 2013-08-12: 50 ug via INTRAVENOUS
  Filled 2013-08-12: qty 1

## 2013-08-12 MED ORDER — ONDANSETRON HCL 4 MG/2ML IJ SOLN
INTRAMUSCULAR | Status: DC | PRN
  Administered 2013-08-12: 4 mg via INTRAVENOUS

## 2013-08-12 MED ORDER — CEFAZOLIN SODIUM-DEXTROSE 2-3 GM-% IV SOLR
2.0000 g | INTRAVENOUS | Status: AC
  Administered 2013-08-12: 2 g via INTRAVENOUS
  Filled 2013-08-12: qty 50

## 2013-08-12 MED ORDER — CIPROFLOXACIN HCL 500 MG PO TABS: 500.0000 mg | ORAL_TABLET | Freq: Two times a day (BID) | ORAL | Status: DC

## 2013-08-12 MED ORDER — FENTANYL CITRATE 0.05 MG/ML IJ SOLN
100.0000 ug | Freq: Once | INTRAMUSCULAR | Status: AC
  Administered 2013-08-12 (×2): 50 ug via INTRAVENOUS
  Filled 2013-08-12: qty 2

## 2013-08-12 MED ORDER — DEXAMETHASONE SODIUM PHOSPHATE 4 MG/ML IJ SOLN
INTRAMUSCULAR | Status: DC | PRN
  Administered 2013-08-12: 10 mg via INTRAVENOUS

## 2013-08-12 MED ORDER — PROMETHAZINE HCL 25 MG/ML IJ SOLN
6.2500 mg | INTRAMUSCULAR | Status: DC | PRN
Start: 2013-08-12 — End: 2013-08-12
  Filled 2013-08-12: qty 1

## 2013-08-12 MED ORDER — CEFAZOLIN SODIUM-DEXTROSE 2-3 GM-% IV SOLR
INTRAVENOUS | Status: AC
  Filled 2013-08-12: qty 50

## 2013-08-12 MED ORDER — ACETAMINOPHEN 10 MG/ML IV SOLN
INTRAVENOUS | Status: DC | PRN
  Administered 2013-08-12: 1000 mg via INTRAVENOUS

## 2013-08-12 MED ORDER — FENTANYL CITRATE 0.05 MG/ML IJ SOLN
INTRAMUSCULAR | Status: DC | PRN
  Administered 2013-08-12: 50 ug via INTRAVENOUS
  Administered 2013-08-12 (×2): 25 ug via INTRAVENOUS

## 2013-08-12 MED ORDER — OXYCODONE-ACETAMINOPHEN 5-325 MG PO TABS
1.0000 | ORAL_TABLET | ORAL | Status: DC | PRN
Start: 2013-08-12 — End: 2013-09-24

## 2013-08-12 MED ORDER — SUCCINYLCHOLINE CHLORIDE 20 MG/ML IJ SOLN
INTRAMUSCULAR | Status: DC | PRN
  Administered 2013-08-12: 20 mg via INTRAVENOUS

## 2013-08-12 MED ORDER — LIDOCAINE HCL (CARDIAC) 20 MG/ML IV SOLN
INTRAVENOUS | Status: DC | PRN
  Administered 2013-08-12: 80 mg via INTRAVENOUS

## 2013-08-12 MED ORDER — GLYCOPYRROLATE 0.2 MG/ML IJ SOLN
INTRAMUSCULAR | Status: DC | PRN
Start: 2013-08-12 — End: 2013-08-12
  Administered 2013-08-12: 0.2 mg via INTRAVENOUS

## 2013-08-12 MED ORDER — LIDOCAINE HCL 2 % EX GEL
CUTANEOUS | Status: DC | PRN
  Administered 2013-08-12: 1 via URETHRAL

## 2013-08-12 MED ORDER — MIDAZOLAM HCL 2 MG/2ML IJ SOLN
INTRAMUSCULAR | Status: AC
  Filled 2013-08-12: qty 2

## 2013-08-12 SURGICAL SUPPLY — 44 items
BAG DRAIN URO-CYSTO SKYTR STRL (DRAIN) ×3 IMPLANT
BASKET LASER NITINOL 1.9FR (BASKET) IMPLANT
BASKET STNLS GEMINI 4WIRE 3FR (BASKET) IMPLANT
BASKET STONE 1.7 NGAGE (UROLOGICAL SUPPLIES) ×3 IMPLANT
BASKET ZERO TIP NITINOL 2.4FR (BASKET) IMPLANT
CANISTER SUCT LVC 12 LTR MEDI- (MISCELLANEOUS) ×3 IMPLANT
CATH CLEAR GEL 3F BACKSTOP (CATHETERS) IMPLANT
CATH FOLEY 3WAY 30CC 22F (CATHETERS) ×3 IMPLANT
CATH INTERMIT  6FR 70CM (CATHETERS) IMPLANT
CATH URET 5FR 28IN CONE TIP (BALLOONS)
CATH URET 5FR 28IN OPEN ENDED (CATHETERS) ×3 IMPLANT
CATH URET 5FR 70CM CONE TIP (BALLOONS) IMPLANT
CATH URET DUAL LUMEN 6-10FR 50 (CATHETERS) ×3 IMPLANT
CLOTH BEACON ORANGE TIMEOUT ST (SAFETY) ×3 IMPLANT
DRAPE CAMERA CLOSED 9X96 (DRAPES) ×3 IMPLANT
ELECT REM PT RETURN 9FT ADLT (ELECTROSURGICAL)
ELECTRODE REM PT RTRN 9FT ADLT (ELECTROSURGICAL) IMPLANT
FIBER LASER FLEXIVA 200 (UROLOGICAL SUPPLIES) IMPLANT
FIBER LASER FLEXIVA 365 (UROLOGICAL SUPPLIES) IMPLANT
FIBER LASER TRAC TIP (UROLOGICAL SUPPLIES) ×3 IMPLANT
GLOVE BIO SURGEON STRL SZ 6 (GLOVE) ×3 IMPLANT
GLOVE BIO SURGEON STRL SZ7 (GLOVE) ×3 IMPLANT
GLOVE ECLIPSE 7.0 STRL STRAW (GLOVE) ×3 IMPLANT
GLOVE INDICATOR 6.5 STRL GRN (GLOVE) ×3 IMPLANT
GLOVE INDICATOR 7.5 STRL GRN (GLOVE) ×3 IMPLANT
GOWN PREVENTION PLUS LG XLONG (DISPOSABLE) IMPLANT
GOWN STRL REUS W/ TWL XL LVL3 (GOWN DISPOSABLE) ×1 IMPLANT
GOWN STRL REUS W/TWL XL LVL3 (GOWN DISPOSABLE) ×5 IMPLANT
GUIDEWIRE 0.038 PTFE COATED (WIRE) IMPLANT
GUIDEWIRE ANG ZIPWIRE 038X150 (WIRE) IMPLANT
GUIDEWIRE STR DUAL SENSOR (WIRE) ×3 IMPLANT
IV NS IRRIG 3000ML ARTHROMATIC (IV SOLUTION) ×3 IMPLANT
KIT BALLIN UROMAX 15FX10 (LABEL) IMPLANT
KIT BALLN UROMAX 15FX4 (MISCELLANEOUS) IMPLANT
KIT BALLN UROMAX 26 75X4 (MISCELLANEOUS)
PACK CYSTOSCOPY (CUSTOM PROCEDURE TRAY) ×3 IMPLANT
SET HIGH PRES BAL DIL (LABEL)
SHEATH ACCESS URETERAL 38CM (SHEATH) IMPLANT
SHEATH ACCESS URETERAL 54CM (SHEATH) IMPLANT
SHEATH URET ACCESS 12FR/35CM (UROLOGICAL SUPPLIES) IMPLANT
SHEATH URET ACCESS 12FR/55CM (UROLOGICAL SUPPLIES) IMPLANT
STENT ×3 IMPLANT
STENT PERCUFLEX 4.8FRX24 (STENTS) ×3 IMPLANT
SYRINGE IRR TOOMEY STRL 70CC (SYRINGE) IMPLANT

## 2013-08-12 NOTE — Discharge Instructions (Signed)
DISCHARGE INSTRUCTIONS FOR KIDNEY STONES OR URETERAL STENT ° °MEDICATIONS:  ° °1. Resume all your other meds from home. ° °ACTIVITY °1. No strenuous activity x 1week °2. No driving while on narcotic pain medications °3. Drink plenty of water °4. Continue to walk at home - you can still get blood clots when you are at home, so keep active, but don't over do it. °5. May return to work in 3 days. ° °BATHING °1. You can shower or a bath. ° ° ° °SIGNS/SYMPTOMS TO CALL: °1. Please call us if you have a fever greater than 101.5, uncontrolled  °nausea/vomiting, uncontrolled pain, dizziness, unable to urinate, chest pain, shortness of breath, leg swelling, leg pain, redness around wound, drainage from wound, or any other concerns or questions. ° °You can reach us at [phone]. ° ° °Post Anesthesia Home Care Instructions ° °Activity: °Get plenty of rest for the remainder of the day. A responsible adult should stay with you for 24 hours following the procedure.  °For the next 24 hours, DO NOT: °-Drive a car °-Operate machinery °-Drink alcoholic beverages °-Take any medication unless instructed by your physician °-Make any legal decisions or sign important papers. ° °Meals: °Start with liquid foods such as gelatin or soup. Progress to regular foods as tolerated. Avoid greasy, spicy, heavy foods. If nausea and/or vomiting occur, drink only clear liquids until the nausea and/or vomiting subsides. Call your physician if vomiting continues. ° °Special Instructions/Symptoms: °Your throat may feel dry or sore from the anesthesia or the breathing tube placed in your throat during surgery. If this causes discomfort, gargle with warm salt water. The discomfort should disappear within 24 hours. ° °

## 2013-08-12 NOTE — Anesthesia Preprocedure Evaluation (Signed)

## 2013-08-12 NOTE — Op Note (Signed)
Urology Operative Report  Date of Procedure: 08/12/13  Surgeon: Natalia Leatherwoodaniel Tayven Renteria, MD Assistant:  None  Preoperative Diagnosis: Right ureter stone. Postoperative Diagnosis:  Same  Procedure(s): Staged right ureteroscopy with laser lithotripsy, stone removal and right ureter stent placement (4.8 x 24JJ without tether). Right ureter stent removal. Cystoscopy.  Estimated blood loss: None  Specimen: Stones provided to the patient.  Drains: None  Complications: None  Findings: Large right ureter stones.  History of present illness: 53 year old male presents today for right staged ureteroscopy for obstructing ureter stones.   Procedure in detail: After informed consent was obtained, the patient was taken to the operating room. They were placed in the supine position. SCDs were turned on and in place. IV antibiotics were infused, and general anesthesia was induced. A timeout was performed in which the correct patient, surgical site, and procedure were identified and agreed upon by the team.  The patient was placed in a dorsolithotomy position, making sure to pad all pertinent neurovascular pressure points.  A belladonna and opium suppository was placed into the rectum. The genitals were prepped and draped in the usual sterile fashion.  A rigid cystoscope was advanced through the urethra and into the bladder. Attention was turned to the right ureter orifice. I placed a sensor wire alongside the right indwelling ureter stent up into the right renal pelvis. I then removed the stent with a stent grasper and secure the wires a safety wire.  The patient was then paralyzed and I navigated a semirigid ureteroscope through the urethra, into the bladder, and into the distal ureter with ease. I encountered a large ureter stone which actually appeared to be 2 large stones embedded in the wall of the right distal ureter. I performed lithotripsy with a 200  holmium laser fiber at settings of 0.5 J and  20 Hz. The stone was fragmented into smaller pieces which washed out of the ureter. The larger pieces were removed with a basket. I then navigated the rigid ureteroscope up to the proximal ureter and there were no further lesions or stones. I withdrew the ureter scope in the area where the stones had been embedded were inflamed as expected. I felt it would be best to leave a stent in place.  I withdrew the ureter scope and loaded a rigid cystoscope over the safety wire and navigated this to the urethra and into the bladder. A 4.8 x 24 double-J ureter stent without tether was placed over the wire through the cystoscope under fluoroscopy up into the right ureter with a good curl deployed in the right renal pelvis and a curl in the bladder.  The bladder was drained and the fragments were removed and provided to the patient.  He was placed back in a supine position, anesthesia was reversed, and [facility].  He will be given cipro to start the day before his stent removal in clinic.  All counts were correct at the end of the case.

## 2013-08-12 NOTE — Anesthesia Procedure Notes (Signed)
Procedure Name: LMA Insertion Date/Time: 08/12/2013 11:38 AM Performed by: Jessica PriestBEESON, Gregory Dickson Pre-anesthesia Checklist: Patient identified, Emergency Drugs available, Suction available and Patient being monitored Patient Re-evaluated:Patient Re-evaluated prior to inductionOxygen Delivery Method: Circle System Utilized Preoxygenation: Pre-oxygenation with 100% oxygen Intubation Type: IV induction Ventilation: Mask ventilation without difficulty LMA: LMA inserted LMA Size: 4.0 Number of attempts: 1 Airway Equipment and Method: bite block Placement Confirmation: positive ETCO2 Tube secured with: Tape Dental Injury: Teeth and Oropharynx as per pre-operative assessment

## 2013-08-12 NOTE — Transfer of Care (Signed)
Immediate Anesthesia Transfer of Care Note  Patient: Gregory Dickson  Procedure(s) Performed: Procedure(s) (LRB): CYSTOSCOPY , Right URETEROSCOPY AND Right STENT EXCHANGE, Stone extraction,  (Right) HOLMIUM LASER APPLICATION (Right)  Patient Location: PACU  Anesthesia Type: General  Level of Consciousness: awake, sedated, patient cooperative and responds to stimulation  Airway & Oxygen Therapy: Patient Spontanous Breathing and Patient connected to face mask oxygen  Post-op Assessment: Report given to PACU RN, Post -op Vital signs reviewed and stable and Patient moving all extremities  Post vital signs: Reviewed and stable  Complications: No apparent anesthesia complications

## 2013-08-12 NOTE — H&P (Signed)
Urology History and Physical Exam  CC: Right ureter stone.  HPI: 53 year old male presents today for right ureter stone. His daughter is with him today. This was discovered during workup of gross hematuria. The stone is located in the right distal ureter. It is 8 mm in size. It is obstructing. He has a right atrophic kidney. We do not know whether this is atrophic do to chronic obstruction from the stone or whether this was a congenital atrophy. Presented to the operating room 07/29/13 for ureteroscopy, but this was not successful do to stenosis of the ureter. He had a stent placed with plans to return for staged right ureteroscopy. We discussed management options along with risks and benefits, alternatives, and likelihood of achieving goals. He presents today for staged right ureteroscopy, cystoscopy, right retrograde pyelogram, laser lithotripsy, possible right ureter dilation, and right ureter stent exchange.  PMH: Past Medical History  Diagnosis Date  . History of kidney stones     multiple  . Back pain     past hx. back surgery, recent Xray done 07-20-13    PSH: Past Surgical History  Procedure Laterality Date  . Back surgery      Lumbar -6 yrs ago  . Wrist fracture surgery      retained hardware  . Cystoscopy with retrograde pyelogram, ureteroscopy and stent placement Right 07/29/2013    Procedure: CYSTOSCOPY WITH RETROGRADE PYELOGRAM, URETEROSCOPY AND STENT PLACEMENT;  Surgeon: Magdalene Mollyaniel Y Hiilei Gerst, MD;  Location: [facility];  Service: Urology;  Laterality: Right;    Allergies: No Known Allergies  Medications: Prescriptions prior to admission  Medication Sig Dispense Refill  . ciprofloxacin (CIPRO) 500 MG tablet Take 1 tablet (500 mg total) by mouth 2 (two) times daily. Take for 3 days, then start again 3 days before your surgery.  12 tablet  0  . oxyCODONE-acetaminophen (PERCOCET) 5-325 MG per tablet Take 1-2 tablets by mouth every 4 (four) hours as needed.  60 tablet  0  .  phenazopyridine (PYRIDIUM) 200 MG tablet Take 1 tablet (200 mg total) by mouth 3 (three) times daily as needed for pain.  30 tablet  6  . senna-docusate (SENOKOT S) 8.6-50 MG per tablet Take 1 tablet by mouth 2 (two) times daily.  60 tablet  0  . tamsulosin (FLOMAX) 0.4 MG CAPS capsule Take 1 capsule (0.4 mg total) by mouth at bedtime.  30 capsule  3     Social History: History   Social History  . Marital Status: Married    Spouse Name: N/A    Number of Children: N/A  . Years of Education: N/A   Occupational History  . Not on file.   Social History Main Topics  . Smoking status: Never Smoker   . Smokeless tobacco: Never Used  . Alcohol Use: No  . Drug Use: No  . Sexual Activity: Yes   Other Topics Concern  . Not on file   Social History Narrative  . No narrative on file    Family History: History reviewed. No pertinent family history.  Review of Systems: Positive: Right flank pain. Negative: Fever, SOB, or chest pain.  A further 10 point review of systems was negative except what is listed in the HPI.  Physical Exam: Filed Vitals:   08/12/13 0800  BP: 129/83  Pulse: 82  Temp: 97 F (36.1 C)  Resp: 16    General: No acute distress.  Awake. Head:  Normocephalic.  Atraumatic. ENT:  EOMI.  Mucous membranes  moist Neck:  Supple.  No lymphadenopathy. CV:  S1 present. S2 present. Regular rate. Pulmonary: Equal effort bilaterally.  Clear to auscultation bilaterally. Abdomen: Soft.  Non- tender to palpation. Skin:  Normal turgor.  No visible rash. Extremity: No gross deformity of bilateral upper extremities.  No gross deformity of    bilateral lower extremities. Neurologic: Alert. Appropriate mood.    Studies:  No results found for this basename: HGB, WBC, PLT,  in the last 72 hours  No results found for this basename: NA, K, CL, CO2, BUN, CREATININE, CALCIUM, MAGNESIUM, GFRNONAA, GFRAA,  in the last 72 hours   No results found for this basename: PT, INR,  APTT,  in the last 72 hours   No components found with this basename: ABG,     Assessment:  Right ureter stone.  Plan: To OR for staged right ureteroscopy, cystoscopy, right retrograde pyelogram, laser lithotripsy, possible right ureter dilation, and right ureter stent exchange.

## 2013-08-13 ENCOUNTER — Encounter (HOSPITAL_BASED_OUTPATIENT_CLINIC_OR_DEPARTMENT_OTHER): Payer: Self-pay | Admitting: Urology

## 2013-08-13 NOTE — Anesthesia Postprocedure Evaluation (Signed)
  Anesthesia Post-op Note  Patient: Gregory Dickson  Procedure(s) Performed: Procedure(s) (LRB): CYSTOSCOPY , Right URETEROSCOPY AND Right STENT EXCHANGE, Stone extraction,  (Right) HOLMIUM LASER APPLICATION (Right)  Patient Location: PACU  Anesthesia Type: General  Level of Consciousness: awake and alert   Airway and Oxygen Therapy: Patient Spontanous Breathing  Post-op Pain: mild  Post-op Assessment: Post-op Vital signs reviewed, Patient's Cardiovascular Status Stable, Respiratory Function Stable, Patent Airway and No signs of Nausea or vomiting  Last Vitals:  Filed Vitals:   08/12/13 1458  BP: 139/90  Pulse: 61  Temp: 36.1 C  Resp: 16    Post-op Vital Signs: stable   Complications: No apparent anesthesia complications

## 2013-09-02 ENCOUNTER — Other Ambulatory Visit: Payer: Self-pay | Admitting: Surgical

## 2013-09-14 ENCOUNTER — Encounter (HOSPITAL_COMMUNITY): Payer: Self-pay | Admitting: Pharmacy Technician

## 2013-09-15 NOTE — H&P (Signed)
Jhon Marty HeckDacat is an 53 y.o. male.   Chief Complaint: back pain HPI: Makye is a 53 year old male who presented with the chief complaint of low back pain with radiation of pain into his LE. He has a history of lumbar decompression L4-L5 with microdiscectomy at L4-L5 on the left nearly four years ago. He has had increased back pain over the past last couple months. No change in activity or injury. No changes in bladder or bowel function other than some issues with kidney stones which has required lithotripsy. He has not demonstrated any neurological deficits in the LE. MRI and CT myelogram of the lumbar spine shows lumbar spinal stenosis L3-L4 with disc herniation to the left at L4-L5.   Past Medical History  Diagnosis Date  . History of kidney stones     multiple  . Back pain     past hx. back surgery, recent Xray done 07-20-13    Past Surgical History  Procedure Laterality Date  . Back surgery      Lumbar -6 yrs ago  . Wrist fracture surgery      retained hardware  . Cystoscopy with retrograde pyelogram, ureteroscopy and stent placement Right 07/29/2013    Procedure: CYSTOSCOPY WITH RETROGRADE PYELOGRAM, URETEROSCOPY AND STENT PLACEMENT;  Surgeon: Magdalene Mollyaniel Y Woodruff, MD;  Location: [facility];  Service: Urology;  Laterality: Right;  . Cystoscopy with retrograde pyelogram, ureteroscopy and stent placement Right 08/12/2013    Procedure: CYSTOSCOPY , Right URETEROSCOPY AND Right STENT EXCHANGE, Stone extraction, ;  Surgeon: Magdalene Mollyaniel Y Woodruff, MD;  [facility];  Service: Urology;  Laterality: Right;  . Holmium laser application Right 08/12/2013    Procedure: HOLMIUM LASER APPLICATION;  Surgeon: Magdalene Mollyaniel Y Woodruff, MD;  [facility];  Service: Urology;  Laterality: Right;    Social History:  reports that he has never smoked. He has never used smokeless tobacco. He reports that he does not drink alcohol or use illicit drugs.  Allergies: No Known Allergies  No  current facility-administered medications for this encounter. Current outpatient prescriptions: oxyCODONE-acetaminophen (PERCOCET) 5-325 MG per tablet, Take 1-2 tablets by mouth every 4 (four) hours as needed., Disp: 40 tablet, Rfl: 0   Review of Systems  Constitutional: Positive for diaphoresis. Negative for fever, chills, weight loss and malaise/fatigue.  HENT: Negative for congestion, ear discharge, ear pain, hearing loss, nosebleeds, sore throat and tinnitus.   Eyes: Negative.   Respiratory: Negative.  Negative for stridor.   Cardiovascular: Negative.   Gastrointestinal: Negative.   Genitourinary: Negative.   Musculoskeletal: Positive for back pain and joint pain. Negative for falls and neck pain.  Skin: Negative.   Neurological: Positive for tingling and headaches. Negative for dizziness, tremors, sensory change, speech change, focal weakness, seizures, loss of consciousness and weakness.  Endo/Heme/Allergies: Positive for environmental allergies. Negative for polydipsia. Does not bruise/bleed easily.  Psychiatric/Behavioral: Positive for memory loss. Negative for depression, suicidal ideas, hallucinations and substance abuse. The patient is not nervous/anxious and does not have insomnia.    Vitals Weight: 146 lb Height: 64 in Body Surface Area: 1.73 m Body Mass Index: 25.06 kg/m Pulse: 58 (Regular) BP: 136/91 (Sitting, Left Arm, Standard)  Physical Exam  Constitutional: He is oriented to person, place, and time. He appears well-developed and well-nourished. No distress.  HENT:  Head: Normocephalic and atraumatic.  Right Ear: External ear normal.  Left Ear: External ear normal.  Nose: Nose normal.  Mouth/Throat: Oropharynx is clear and moist.  Eyes:  Conjunctivae and EOM are normal.  Neck: Normal range of motion. Neck supple.  Cardiovascular: Regular rhythm, normal heart sounds and intact distal pulses.  Bradycardia present.   No murmur heard. Respiratory: Effort  normal and breath sounds normal. No respiratory distress. He has no wheezes.  GI: Soft. Bowel sounds are normal. He exhibits no distension. There is no tenderness.  Musculoskeletal:       Right hip: Normal.       Left hip: Normal.       Right knee: Normal.       Left knee: Normal.       Lumbar back: He exhibits decreased range of motion, tenderness, pain and spasm. He exhibits no bony tenderness.       Right lower leg: He exhibits no tenderness and no swelling.       Left lower leg: He exhibits no tenderness and no swelling.       Right foot: Normal.       Left foot: Normal.  Positive SLR bilaterally but no neurological deficits in LE  Neurological: He is alert and oriented to person, place, and time. He has normal strength and normal reflexes. No sensory deficit.  Skin: No rash noted. He is not diaphoretic. No erythema.  Psychiatric: He has a normal mood and affect. His behavior is normal.     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 The possible complications of spinal surgery number one could be infection, which is extremely rare. We do use antibiotics prior to the surgery and during surgery and after surgery. Number two is always a slight degree of probability that you could develop a blood clot in your leg after any type of surgery and we try our best to prevent that with aspirin post op when it is safe to begin. The third is a dural leak. That is the spinal fluid leak that could occur. At certain rare times the bone or the disc could literally stick to the dura which is the lining which contains the spinal fluid and we could develop a small tear in that lining which we then patch up. That is an extremely rare complication. The last and final complication is a recurrent disc rupture. That means that you could rupture another small piece of  disc later on down the road and there is about a 2% chance of that.   Cato Liburd LAUREN 09/15/2013, 1:25 PM

## 2013-09-17 ENCOUNTER — Encounter (HOSPITAL_COMMUNITY)
Admission: RE | Admit: 2013-09-17 | Discharge: 2013-09-17 | Disposition: A | Discharge status: Home / Self Care | Payer: No Typology Code available for payment source | Source: Ambulatory Visit | Attending: Orthopedic Surgery | Admitting: Orthopedic Surgery

## 2013-09-17 ENCOUNTER — Encounter (HOSPITAL_COMMUNITY): Payer: Self-pay

## 2013-09-17 ENCOUNTER — Ambulatory Visit (HOSPITAL_COMMUNITY)
Admission: RE | Admit: 2013-09-17 | Discharge: 2013-09-17 | Disposition: A | Discharge status: Home / Self Care | Payer: No Typology Code available for payment source | Source: Ambulatory Visit | Attending: Surgical | Admitting: Surgical

## 2013-09-17 DIAGNOSIS — M48061 Spinal stenosis, lumbar region without neurogenic claudication: Secondary | ICD-10-CM | POA: Diagnosis not present

## 2013-09-17 DIAGNOSIS — Z01818 Encounter for other preprocedural examination: Secondary | ICD-10-CM | POA: Insufficient documentation

## 2013-09-17 DIAGNOSIS — M79609 Pain in unspecified limb: Secondary | ICD-10-CM | POA: Diagnosis not present

## 2013-09-17 DIAGNOSIS — M5126 Other intervertebral disc displacement, lumbar region: Secondary | ICD-10-CM | POA: Diagnosis not present

## 2013-09-17 HISTORY — DX: Spinal stenosis, site unspecified: M48.00

## 2013-09-17 LAB — COMPREHENSIVE METABOLIC PANEL
ALT: 25 U/L (ref 0–53)
AST: 29 U/L (ref 0–37)
Albumin: 4.1 g/dL (ref 3.5–5.2)
Alkaline Phosphatase: 109 U/L (ref 39–117)
Anion gap: 10 (ref 5–15)
BUN: 14 mg/dL (ref 6–23)
CO2: 29 mEq/L (ref 19–32)
Calcium: 10.6 mg/dL — ABNORMAL HIGH (ref 8.4–10.5)
Chloride: 102 mEq/L (ref 96–112)
Creatinine, Ser: 1.09 mg/dL (ref 0.50–1.35)
GFR calc Af Amer: 88 mL/min — ABNORMAL LOW (ref >90)
GFR calc non Af Amer: 76 mL/min — ABNORMAL LOW (ref >90)
Glucose, Bld: 78 mg/dL (ref 70–99)
Potassium: 4.8 mEq/L (ref 3.7–5.3)
Sodium: 141 mEq/L (ref 137–147)
Total Bilirubin: 0.3 mg/dL (ref 0.3–1.2)
Total Protein: 7.7 g/dL (ref 6.0–8.3)

## 2013-09-17 LAB — CBC
HCT: 46.3 % (ref 39.0–52.0)
HEMOGLOBIN: 15.5 g/dL (ref 13.0–17.0)
MCH: 28.2 pg (ref 26.0–34.0)
MCHC: 33.5 g/dL (ref 30.0–36.0)
MCV: 84.3 fL (ref 78.0–100.0)
Platelets: 188 10*3/uL (ref 150–400)
RBC: 5.49 MIL/uL (ref 4.22–5.81)
RDW: 13.1 % (ref 11.5–15.5)
WBC: 5.2 10*3/uL (ref 4.0–10.5)

## 2013-09-17 LAB — PROTIME-INR
INR: 0.93 (ref 0.00–1.49)
Prothrombin Time: 12.5 seconds (ref 11.6–15.2)

## 2013-09-17 LAB — URINALYSIS, ROUTINE W REFLEX MICROSCOPIC
Bilirubin Urine: NEGATIVE
Glucose, UA: NEGATIVE mg/dL
Hgb urine dipstick: NEGATIVE
Ketones, ur: NEGATIVE mg/dL
Leukocytes, UA: NEGATIVE
Nitrite: NEGATIVE
Protein, ur: NEGATIVE mg/dL
Specific Gravity, Urine: 1.019 (ref 1.005–1.030)
Urobilinogen, UA: 0.2 mg/dL (ref 0.0–1.0)
pH: 5.5 (ref 5.0–8.0)

## 2013-09-17 LAB — APTT: aPTT: 34 seconds (ref 24–37)

## 2013-09-17 LAB — SURGICAL PCR SCREEN
MRSA, PCR: POSITIVE — AB
STAPHYLOCOCCUS AUREUS: POSITIVE — AB

## 2013-09-17 NOTE — Patient Instructions (Addendum)
[name]  09/17/2013                           YOUR PROCEDURE IS SCHEDULED ON: 09/23/13               ENTER THRU [place] [facility]IN [facility] ENTRANCE AND                           FOLLOW  SIGNS TO SHORT STAY CENTER                 ARRIVE AT SHORT STAY AT: 6:30 AM               CALL THIS NUMBER IF ANY PROBLEMS THE DAY OF SURGERY :               [phone]                                REMEMBER:   Do not eat food or drink liquids AFTER MIDNIGHT                  Take these medicines the morning of surgery with               A SIPS OF WATER :   OXYCODONE IF NEEDED      Do not wear jewelry, make-up   Do not wear lotions, powders, or perfumes.   Do not shave legs or underarms 12 hrs. before surgery (men may shave face)  Do not bring valuables to the hospital.  Contacts, dentures or bridgework may not be worn into surgery.  Leave suitcase in the car. After surgery it may be brought to your room.  For patients admitted to the hospital more than one night, checkout time is            11:00 AM                                                       The day of discharge.   Patients discharged the day of surgery will not be allowed to drive home.            If going home same day of surgery, must have someone stay with you              FIRST 24 hrs at home and arrange for some one to drive you              home from hospital.   ________________________________________________________________________                                                                        [facility] - PREPARING FOR SURGERY  Before surgery, you can play an important role.  Because skin is not sterile, your skin needs to be as free of germs as possible.  You can reduce the number of germs on your skin by washing  with CHG (chlorahexidine gluconate) soap before surgery.  CHG is an antiseptic cleaner which kills germs and bonds with the skin to continue killing germs even after washing. Please DO  NOT use if you have an allergy to CHG or antibacterial soaps.  If your skin becomes reddened/irritated stop using the CHG and inform your nurse when you arrive at Short Stay. Do not shave (including legs and underarms) for at least 48 hours prior to the first CHG shower.  You may shave your face. Please follow these instructions carefully:   1.  Shower with CHG Soap the night before surgery and the  morning of Surgery.   2.  If you choose to wash your hair, wash your hair first as usual with your  normal  Shampoo.   3.  After you shampoo, rinse your hair and body thoroughly to remove the  shampoo.                                         4.  Use CHG as you would any other liquid soap.  You can apply chg directly  to the skin and wash . Gently wash with scrungie or clean wascloth    5.  Apply the CHG Soap to your body ONLY FROM THE NECK DOWN.   Do not use on open                           Wound or open sores. Avoid contact with eyes, ears mouth and genitals (private parts).                        Genitals (private parts) with your normal soap.              6.  Wash thoroughly, paying special attention to the area where your surgery  will be performed.   7.  Thoroughly rinse your body with warm water from the neck down.   8.  DO NOT shower/wash with your normal soap after using and rinsing off  the CHG Soap .                9.  Pat yourself dry with a clean towel.             10.  Wear clean pajamas.             11.  Place clean sheets on your bed the night of your first shower and do not  sleep with pets.  Day of Surgery : Do not apply any lotions/deodorants the morning of surgery.  Please wear clean clothes to the hospital/surgery center.  FAILURE TO FOLLOW THESE INSTRUCTIONS MAY RESULT IN THE CANCELLATION OF YOUR SURGERY    PATIENT SIGNATURE_________________________________  ______________________________________________________________________  Gregory Dickson  An  incentive spirometer is a tool that can help keep your lungs clear and active. This tool measures how well you are filling your lungs with each breath. Taking long deep breaths may help reverse or decrease the chance of developing breathing (pulmonary) problems (especially infection) following:  A long period of time when you are unable to move or be active. BEFORE THE PROCEDURE   If the spirometer includes an indicator to show your best effort, your nurse or respiratory therapist will set it to a desired goal.  If possible, sit up  straight or lean slightly forward. Try not to slouch.  Hold the incentive spirometer in an upright position. INSTRUCTIONS FOR USE  1. Sit on the edge of your bed if possible, or sit up as far as you can in bed or on a chair. 2. Hold the incentive spirometer in an upright position. 3. Breathe out normally. 4. Place the mouthpiece in your mouth and seal your lips tightly around it. 5. Breathe in slowly and as deeply as possible, raising the piston or the ball toward the top of the column. 6. Hold your breath for 3-5 seconds or for as long as possible. Allow the piston or ball to fall to the bottom of the column. 7. Remove the mouthpiece from your mouth and breathe out normally. 8. Rest for a few seconds and repeat Steps 1 through 7 at least 10 times every 1-2 hours when you are awake. Take your time and take a few normal breaths between deep breaths. 9. The spirometer may include an indicator to show your best effort. Use the indicator as a goal to work toward during each repetition. 10. After each set of 10 deep breaths, practice coughing to be sure your lungs are clear. If you have an incision (the cut made at the time of surgery), support your incision when coughing by placing a pillow or rolled up towels firmly against it. Once you are able to get out of bed, walk around indoors and cough well. You may stop using the incentive spirometer when instructed by your  caregiver.  RISKS AND COMPLICATIONS  Take your time so you do not get dizzy or light-headed.  If you are in pain, you may need to take or ask for pain medication before doing incentive spirometry. It is harder to take a deep breath if you are having pain. AFTER USE  Rest and breathe slowly and easily.  It can be helpful to keep track of a log of your progress. Your caregiver can provide you with a simple table to help with this. If you are using the spirometer at home, follow these instructions: SEEK MEDICAL CARE IF:   You are having difficultly using the spirometer.  You have trouble using the spirometer as often as instructed.  Your pain medication is not giving enough relief while using the spirometer.  You develop fever of 100.5 F (38.1 C) or higher. SEEK IMMEDIATE MEDICAL CARE IF:   You cough up bloody sputum that had not been present before.  You develop fever of 102 F (38.9 C) or greater.  You develop worsening pain at or near the incision site. MAKE SURE YOU:   Understand these instructions.  Will watch your condition.  Will get help right away if you are not doing well or get worse. Document Released: 06/04/2006 Document Revised: 04/16/2011 Document Reviewed: 08/05/2006 [facility] Patient Information 2014 [place], [place].   ________________________________________________________________________

## 2013-09-23 ENCOUNTER — Ambulatory Visit (HOSPITAL_COMMUNITY): Payer: No Typology Code available for payment source

## 2013-09-23 ENCOUNTER — Encounter (HOSPITAL_COMMUNITY)
Admission: RE | Disposition: A | Discharge status: Home / Self Care | Payer: Self-pay | Source: Ambulatory Visit | Attending: Orthopedic Surgery

## 2013-09-23 ENCOUNTER — Encounter (HOSPITAL_COMMUNITY): Payer: Self-pay | Admitting: *Deleted

## 2013-09-23 ENCOUNTER — Observation Stay (HOSPITAL_COMMUNITY)
Admission: RE | Admit: 2013-09-23 | Discharge: 2013-09-25 | Disposition: A | Discharge status: Home / Self Care | Payer: No Typology Code available for payment source | Source: Ambulatory Visit | Attending: Orthopedic Surgery | Admitting: Orthopedic Surgery

## 2013-09-23 ENCOUNTER — Encounter (HOSPITAL_COMMUNITY): Payer: No Typology Code available for payment source | Admitting: Anesthesiology

## 2013-09-23 ENCOUNTER — Ambulatory Visit (HOSPITAL_COMMUNITY): Payer: No Typology Code available for payment source | Admitting: Anesthesiology

## 2013-09-23 DIAGNOSIS — M79609 Pain in unspecified limb: Secondary | ICD-10-CM | POA: Insufficient documentation

## 2013-09-23 DIAGNOSIS — M48062 Spinal stenosis, lumbar region with neurogenic claudication: Secondary | ICD-10-CM

## 2013-09-23 DIAGNOSIS — M5126 Other intervertebral disc displacement, lumbar region: Secondary | ICD-10-CM | POA: Diagnosis not present

## 2013-09-23 DIAGNOSIS — M48061 Spinal stenosis, lumbar region without neurogenic claudication: Secondary | ICD-10-CM | POA: Insufficient documentation

## 2013-09-23 HISTORY — PX: LUMBAR LAMINECTOMY/DECOMPRESSION MICRODISCECTOMY: SHX5026

## 2013-09-23 LAB — TYPE AND SCREEN
ABO/RH(D): B POS
Antibody Screen: NEGATIVE

## 2013-09-23 SURGERY — LUMBAR LAMINECTOMY/DECOMPRESSION MICRODISCECTOMY 2 LEVELS
Anesthesia: General | Site: Back

## 2013-09-23 MED ORDER — SODIUM CHLORIDE 0.9 % IR SOLN
Status: DC | PRN
  Administered 2013-09-23: 10:00:00

## 2013-09-23 MED ORDER — SUCCINYLCHOLINE CHLORIDE 20 MG/ML IJ SOLN
INTRAMUSCULAR | Status: DC | PRN
  Administered 2013-09-23: 100 mg via INTRAVENOUS

## 2013-09-23 MED ORDER — HYDROMORPHONE HCL PF 1 MG/ML IJ SOLN
0.5000 mg | INTRAMUSCULAR | Status: DC | PRN
  Administered 2013-09-23 – 2013-09-24 (×3): 1 mg via INTRAVENOUS
  Administered 2013-09-25: 0.5 mg via INTRAVENOUS
  Filled 2013-09-23 (×5): qty 1

## 2013-09-23 MED ORDER — DEXAMETHASONE SODIUM PHOSPHATE 10 MG/ML IJ SOLN
INTRAMUSCULAR | Status: DC | PRN
  Administered 2013-09-23: 10 mg via INTRAVENOUS

## 2013-09-23 MED ORDER — BISACODYL 5 MG PO TBEC
5.0000 mg | DELAYED_RELEASE_TABLET | Freq: Every day | ORAL | Status: DC | PRN
Start: 2013-09-23 — End: 2013-09-25

## 2013-09-23 MED ORDER — METHOCARBAMOL 1000 MG/10ML IJ SOLN
500.0000 mg | Freq: Four times a day (QID) | INTRAVENOUS | Status: DC | PRN
  Administered 2013-09-23: 500 mg via INTRAVENOUS
  Filled 2013-09-23: qty 5

## 2013-09-23 MED ORDER — MIDAZOLAM HCL 5 MG/5ML IJ SOLN
INTRAMUSCULAR | Status: DC | PRN
  Administered 2013-09-23: 2 mg via INTRAVENOUS

## 2013-09-23 MED ORDER — VANCOMYCIN HCL 1000 MG IV SOLR
1000.0000 mg | INTRAVENOUS | Status: DC | PRN
  Administered 2013-09-23: 1000 mg via INTRAVENOUS

## 2013-09-23 MED ORDER — VANCOMYCIN HCL IN DEXTROSE 1-5 GM/200ML-% IV SOLN
INTRAVENOUS | Status: AC
  Filled 2013-09-23: qty 200

## 2013-09-23 MED ORDER — CEFAZOLIN SODIUM-DEXTROSE 2-3 GM-% IV SOLR
INTRAVENOUS | Status: AC
  Filled 2013-09-23: qty 50

## 2013-09-23 MED ORDER — FENTANYL CITRATE 0.05 MG/ML IJ SOLN
INTRAMUSCULAR | Status: DC | PRN
  Administered 2013-09-23 (×3): 50 ug via INTRAVENOUS

## 2013-09-23 MED ORDER — VANCOMYCIN HCL 1000 MG IV SOLR
1000.0000 mg | Freq: Two times a day (BID) | INTRAVENOUS | Status: DC
Start: 2013-09-23 — End: 2013-09-23

## 2013-09-23 MED ORDER — LIDOCAINE HCL (CARDIAC) 20 MG/ML IV SOLN
INTRAVENOUS | Status: AC
  Filled 2013-09-23: qty 5

## 2013-09-23 MED ORDER — VANCOMYCIN HCL IN DEXTROSE 1-5 GM/200ML-% IV SOLN
1000.0000 mg | Freq: Once | INTRAVENOUS | Status: AC
  Administered 2013-09-23: 1000 mg via INTRAVENOUS
  Filled 2013-09-23: qty 200

## 2013-09-23 MED ORDER — FENTANYL CITRATE 0.05 MG/ML IJ SOLN
INTRAMUSCULAR | Status: AC
  Filled 2013-09-23: qty 5

## 2013-09-23 MED ORDER — LACTATED RINGERS IV SOLN
INTRAVENOUS | Status: DC
  Administered 2013-09-23 (×2): via INTRAVENOUS

## 2013-09-23 MED ORDER — BUPIVACAINE-EPINEPHRINE (PF) 0.5% -1:200000 IJ SOLN
INTRAMUSCULAR | Status: AC
  Filled 2013-09-23: qty 30

## 2013-09-23 MED ORDER — PHENOL 1.4 % MT LIQD: 1.0000 | OROMUCOSAL | Status: DC | PRN

## 2013-09-23 MED ORDER — FLEET ENEMA 7-19 GM/118ML RE ENEM: 1.0000 | ENEMA | Freq: Once | RECTAL | Status: AC | PRN

## 2013-09-23 MED ORDER — BACITRACIN-NEOMYCIN-POLYMYXIN 400-5-5000 EX OINT
TOPICAL_OINTMENT | CUTANEOUS | Status: AC
  Filled 2013-09-23: qty 1

## 2013-09-23 MED ORDER — MIDAZOLAM HCL 2 MG/2ML IJ SOLN
INTRAMUSCULAR | Status: AC
  Filled 2013-09-23: qty 2

## 2013-09-23 MED ORDER — BUPIVACAINE LIPOSOME 1.3 % IJ SUSP
20.0000 mL | Freq: Once | INTRAMUSCULAR | Status: AC
  Administered 2013-09-23: 17 mL
  Filled 2013-09-23: qty 20

## 2013-09-23 MED ORDER — OXYCODONE-ACETAMINOPHEN 5-325 MG PO TABS
1.0000 | ORAL_TABLET | ORAL | Status: DC | PRN
  Administered 2013-09-23 – 2013-09-25 (×8): 2 via ORAL
  Filled 2013-09-23 (×3): qty 2
  Filled 2013-09-23 (×2): qty 1
  Filled 2013-09-23 (×4): qty 2

## 2013-09-23 MED ORDER — THROMBIN 5000 UNITS EX SOLR
CUTANEOUS | Status: AC
  Filled 2013-09-23: qty 10000

## 2013-09-23 MED ORDER — HYDROMORPHONE HCL PF 1 MG/ML IJ SOLN
INTRAMUSCULAR | Status: AC
  Filled 2013-09-23: qty 1

## 2013-09-23 MED ORDER — CEFAZOLIN SODIUM-DEXTROSE 2-3 GM-% IV SOLR
2.0000 g | INTRAVENOUS | Status: AC
  Administered 2013-09-23: 2 g via INTRAVENOUS

## 2013-09-23 MED ORDER — LACTATED RINGERS IV SOLN
INTRAVENOUS | Status: DC
  Administered 2013-09-23 – 2013-09-24 (×3): via INTRAVENOUS

## 2013-09-23 MED ORDER — PROPOFOL 10 MG/ML IV BOLUS
INTRAVENOUS | Status: DC | PRN
  Administered 2013-09-23: 160 mg via INTRAVENOUS

## 2013-09-23 MED ORDER — ONDANSETRON HCL 4 MG/2ML IJ SOLN
4.0000 mg | INTRAMUSCULAR | Status: DC | PRN
  Administered 2013-09-24: 4 mg via INTRAVENOUS
  Filled 2013-09-23: qty 2

## 2013-09-23 MED ORDER — PROMETHAZINE HCL 25 MG/ML IJ SOLN: 6.2500 mg | INTRAMUSCULAR | Status: DC | PRN

## 2013-09-23 MED ORDER — ONDANSETRON HCL 4 MG/2ML IJ SOLN
INTRAMUSCULAR | Status: DC | PRN
  Administered 2013-09-23: 4 mg via INTRAVENOUS

## 2013-09-23 MED ORDER — POLYETHYLENE GLYCOL 3350 17 G PO PACK: 17.0000 g | PACK | Freq: Every day | ORAL | Status: DC | PRN

## 2013-09-23 MED ORDER — ACETAMINOPHEN 325 MG PO TABS
650.0000 mg | ORAL_TABLET | ORAL | Status: DC | PRN
  Administered 2013-09-24: 650 mg via ORAL
  Filled 2013-09-23: qty 2

## 2013-09-23 MED ORDER — MENTHOL 3 MG MT LOZG
1.0000 | LOZENGE | OROMUCOSAL | Status: DC | PRN
  Filled 2013-09-23: qty 9

## 2013-09-23 MED ORDER — HYDROMORPHONE HCL PF 1 MG/ML IJ SOLN
0.2500 mg | INTRAMUSCULAR | Status: DC | PRN
  Administered 2013-09-23 (×4): 0.5 mg via INTRAVENOUS

## 2013-09-23 MED ORDER — ACETAMINOPHEN 10 MG/ML IV SOLN
1000.0000 mg | Freq: Once | INTRAVENOUS | Status: AC
  Administered 2013-09-23: 1000 mg via INTRAVENOUS
  Filled 2013-09-23: qty 100

## 2013-09-23 MED ORDER — HYDROMORPHONE HCL PF 1 MG/ML IJ SOLN
0.2500 mg | INTRAMUSCULAR | Status: DC | PRN
  Administered 2013-09-23 (×2): 0.5 mg via INTRAVENOUS

## 2013-09-23 MED ORDER — METHOCARBAMOL 500 MG PO TABS
500.0000 mg | ORAL_TABLET | Freq: Four times a day (QID) | ORAL | Status: DC | PRN
Start: 2013-09-23 — End: 2013-09-25
  Administered 2013-09-23 – 2013-09-25 (×4): 500 mg via ORAL
  Filled 2013-09-23 (×4): qty 1

## 2013-09-23 MED ORDER — HYDROCODONE-ACETAMINOPHEN 5-325 MG PO TABS: 1.0000 | ORAL_TABLET | ORAL | Status: DC | PRN

## 2013-09-23 MED ORDER — ROCURONIUM BROMIDE 100 MG/10ML IV SOLN
INTRAVENOUS | Status: DC | PRN
  Administered 2013-09-23: 40 mg via INTRAVENOUS

## 2013-09-23 MED ORDER — LIDOCAINE HCL (CARDIAC) 20 MG/ML IV SOLN
INTRAVENOUS | Status: DC | PRN
  Administered 2013-09-23: 100 mg via INTRAVENOUS

## 2013-09-23 MED ORDER — PROPOFOL 10 MG/ML IV BOLUS
INTRAVENOUS | Status: AC
  Filled 2013-09-23: qty 20

## 2013-09-23 MED ORDER — BUPIVACAINE-EPINEPHRINE 0.5% -1:200000 IJ SOLN
INTRAMUSCULAR | Status: DC | PRN
  Administered 2013-09-23: 17 mL

## 2013-09-23 MED ORDER — GLYCOPYRROLATE 0.2 MG/ML IJ SOLN
INTRAMUSCULAR | Status: DC | PRN
  Administered 2013-09-23: 0.4 mg via INTRAVENOUS

## 2013-09-23 MED ORDER — DOCUSATE SODIUM 100 MG PO CAPS
100.0000 mg | ORAL_CAPSULE | Freq: Two times a day (BID) | ORAL | Status: DC
  Administered 2013-09-23 – 2013-09-24 (×3): 100 mg via ORAL

## 2013-09-23 MED ORDER — ACETAMINOPHEN 650 MG RE SUPP: 650.0000 mg | RECTAL | Status: DC | PRN

## 2013-09-23 MED ORDER — NEOSTIGMINE METHYLSULFATE 10 MG/10ML IV SOLN
INTRAVENOUS | Status: DC | PRN
  Administered 2013-09-23: 3 mg via INTRAVENOUS

## 2013-09-23 MED ORDER — THROMBIN 5000 UNITS EX SOLR
CUTANEOUS | Status: DC | PRN
  Administered 2013-09-23: 10000 [IU] via TOPICAL

## 2013-09-23 SURGICAL SUPPLY — 43 items
BAG ZIPLOCK 12X15 (MISCELLANEOUS) ×3 IMPLANT
BENZOIN TINCTURE PRP APPL 2/3 (GAUZE/BANDAGES/DRESSINGS) ×3 IMPLANT
CLEANER TIP ELECTROSURG 2X2 (MISCELLANEOUS) ×3 IMPLANT
DRAIN PENROSE 18X1/4 LTX STRL (WOUND CARE) IMPLANT
DRAPE MICROSCOPE LEICA (MISCELLANEOUS) ×3 IMPLANT
DRAPE POUCH INSTRU U-SHP 10X18 (DRAPES) ×3 IMPLANT
DRAPE SURG 17X11 SM STRL (DRAPES) ×3 IMPLANT
DRSG ADAPTIC 3X8 NADH LF (GAUZE/BANDAGES/DRESSINGS) ×3 IMPLANT
DRSG PAD ABDOMINAL 8X10 ST (GAUZE/BANDAGES/DRESSINGS) ×9 IMPLANT
DURAPREP 26ML APPLICATOR (WOUND CARE) ×3 IMPLANT
ELECT REM PT RETURN 9FT ADLT (ELECTROSURGICAL) ×3
ELECTRODE REM PT RTRN 9FT ADLT (ELECTROSURGICAL) ×1 IMPLANT
GLOVE BIOGEL PI IND STRL 8 (GLOVE) ×1 IMPLANT
GLOVE BIOGEL PI INDICATOR 8 (GLOVE) ×2
GLOVE ECLIPSE 8.0 STRL XLNG CF (GLOVE) ×9 IMPLANT
GLOVE INDICATOR 8.0 STRL GRN (GLOVE) ×3 IMPLANT
GOWN STRL REUS W/TWL XL LVL3 (GOWN DISPOSABLE) ×6 IMPLANT
KIT BASIN OR (CUSTOM PROCEDURE TRAY) ×3 IMPLANT
KIT POSITIONING SURG ANDREWS (MISCELLANEOUS) ×3 IMPLANT
MANIFOLD NEPTUNE II (INSTRUMENTS) ×3 IMPLANT
NEEDLE HYPO 22GX1.5 SAFETY (NEEDLE) ×3 IMPLANT
NEEDLE SPNL 18GX3.5 QUINCKE PK (NEEDLE) ×6 IMPLANT
NS IRRIG 1000ML POUR BTL (IV SOLUTION) IMPLANT
PAD ABD 8X10 STRL (GAUZE/BANDAGES/DRESSINGS) ×3 IMPLANT
PATTIES SURGICAL .5 X.5 (GAUZE/BANDAGES/DRESSINGS) IMPLANT
PATTIES SURGICAL .75X.75 (GAUZE/BANDAGES/DRESSINGS) IMPLANT
PATTIES SURGICAL 1X1 (DISPOSABLE) IMPLANT
PIN SAFETY NICK PLATE  2 MED (MISCELLANEOUS)
PIN SAFETY NICK PLATE 2 MED (MISCELLANEOUS) IMPLANT
POSITIONER SURGICAL ARM (MISCELLANEOUS) IMPLANT
SPONGE LAP 4X18 X RAY DECT (DISPOSABLE) ×6 IMPLANT
SPONGE SURGIFOAM ABS GEL 100 (HEMOSTASIS) ×3 IMPLANT
STAPLER VISISTAT 35W (STAPLE) ×3 IMPLANT
SUT VIC AB 0 CT1 27 (SUTURE)
SUT VIC AB 0 CT1 27XBRD ANTBC (SUTURE) IMPLANT
SUT VIC AB 1 CT1 27 (SUTURE) ×8
SUT VIC AB 1 CT1 27XBRD ANTBC (SUTURE) ×4 IMPLANT
SYRINGE 20CC LL (MISCELLANEOUS) ×6 IMPLANT
TOWEL OR 17X26 10 PK STRL BLUE (TOWEL DISPOSABLE) ×3 IMPLANT
TOWEL OR NON WOVEN STRL DISP B (DISPOSABLE) IMPLANT
TRAY FOLEY CATH 14FRSI W/METER (CATHETERS) IMPLANT
TRAY FOLEY METER SIL LF 16FR (CATHETERS) ×3 IMPLANT
TRAY LAMINECTOMY (CUSTOM PROCEDURE TRAY) ×3 IMPLANT

## 2013-09-23 NOTE — Brief Op Note (Signed)
09/23/2013  10:11 AM  PATIENT:  Laquentin Cargile  53 y.o. male  PRE-OPERATIVE DIAGNOSIS:  SPINAL STENOSIS, HNP at L-3-L-4  POST-OPERATIVE DIAGNOSIS:  SPINAL STENOSIS, HNP at L-3-L-4   PROCEDURE:  Complete Lumbar Decompressive Lumbar Laminectomy for Spinal Stenosis at L-3-L-4 and Microdiscectomy at L-3-L-4 and Foraminotomies at L-3 and L-4 Nerve Roots on the left for Foraminal Stenosis. SURGEON:  Surgeon(s) and Role:    * Jacki Conesonald A Angeni Chaudhuri, MD - Primary    * Javier DockerJeffrey C Beane, MD - Assisting     ASSISTANTS: Jene EveryJeffrey Beane MD   ANESTHESIA:   general  EBL:  Total I/O In: 1000 [I.V.:1000] Out: 100 [Blood:100]  BLOOD ADMINISTERED:none  DRAINS: none   LOCAL MEDICATIONS USED:  MARCAINE  20cc of 0.25% with Epinephrine at start of case and 15cc of Exparel at the end of the case.   SPECIMEN:  Source of Specimen:  L-3-L-4  DISPOSITION OF SPECIMEN:  PATHOLOGY  COUNTS:  YES  TOURNIQUET:  * No tourniquets in log *  DICTATION: .Other Dictation: Dictation Number [id number]  PLAN OF CARE: Admit for overnight observation  PATIENT DISPOSITION:  PACU - hemodynamically stable.   Delay start of Pharmacological VTE agent (>24hrs) due to surgical blood loss or risk of bleeding: yes

## 2013-09-23 NOTE — Anesthesia Preprocedure Evaluation (Addendum)
Anesthesia Evaluation  Patient identified by MRN, date of birth, ID band Patient awake    Reviewed: Allergy & Precautions, H&P , NPO status , Patient's Chart, lab work & pertinent test results  Airway Mallampati: II TM Distance: >3 FB Neck ROM: Full    Dental  (+) Teeth Intact, Dental Advisory Given   Pulmonary neg pulmonary ROS,  breath sounds clear to auscultation        Cardiovascular negative cardio ROS  Rhythm:Regular Rate:Normal     Neuro/Psych negative neurological ROS     GI/Hepatic negative GI ROS, Neg liver ROS,   Endo/Other  negative endocrine ROS  Renal/GU negative Renal ROS  negative genitourinary   Musculoskeletal negative musculoskeletal ROS (+)   Abdominal   Peds  Hematology negative hematology ROS (+)   Anesthesia Other Findings   Reproductive/Obstetrics negative OB ROS                          Anesthesia Physical Anesthesia Plan  ASA: II  Anesthesia Plan: General   Post-op Pain Management:    Induction: Intravenous  Airway Management Planned: Oral ETT  Additional Equipment: None  Intra-op Plan:   Post-operative Plan: Extubation in OR  Informed Consent: I have reviewed the patients History and Physical, chart, labs and discussed the procedure including the risks, benefits and alternatives for the proposed anesthesia with the patient or authorized representative who has indicated his/her understanding and acceptance.   Dental advisory given  Plan Discussed with: CRNA  Anesthesia Plan Comments:         Anesthesia Quick Evaluation

## 2013-09-23 NOTE — Interval H&P Note (Signed)
History and Physical Interval Note:  09/23/2013 8:05 AM  Gregory Dickson  has presented today for surgery, with the diagnosis of SPINAL STENOSIS, HNP   The various methods of treatment have been discussed with the patient and family. After consideration of risks, benefits and other options for treatment, the patient has consented to  Procedure(s): CENTRAL DECOMPRESSIVE L3-L4 AND MICRODISCECTOMY L4-L5 LEFT FOR RECURRECT HNP (2 LEVELS) (N/A) as a surgical intervention .  The patient's history has been reviewed, patient examined, no change in status, stable for surgery.  I have reviewed the patient's chart and labs.  Questions were answered to the patient's satisfaction.     Sherrell Weir A

## 2013-09-23 NOTE — Anesthesia Postprocedure Evaluation (Signed)
  Anesthesia Post-op Note  Patient: Gregory Dickson  Procedure(s) Performed: Procedure(s): CENTRAL DECOMPRESSIVE L3-4 for L3 ROOT, L4 ROOT LEFT L3-4 MICRODISCECTOMY LEFT (N/A)  Patient Location: PACU  Anesthesia Type:General  Level of Consciousness: awake, alert  and oriented  Airway and Oxygen Therapy: Patient Spontanous Breathing  Post-op Pain: mild  Post-op Assessment: Post-op Vital signs reviewed  Post-op Vital Signs: Reviewed  Last Vitals:  Filed Vitals:   09/23/13 1142  BP: 128/73  Pulse: 59  Temp: 36.5 C  Resp: 15    Complications: No apparent anesthesia complications

## 2013-09-23 NOTE — Transfer of Care (Signed)
Immediate Anesthesia Transfer of Care Note  Patient: Gregory Dickson  Procedure(s) Performed: Procedure(s): CENTRAL DECOMPRESSIVE L3-L4 AND MICRODISCECTOMY L4-L5 LEFT FOR RECURRECT HNP (2 LEVELS) (N/A)  Patient Location: PACU  Anesthesia Type:General  Level of Consciousness: sedated  Airway & Oxygen Therapy: Patient Spontanous Breathing and Patient connected to face mask oxygen  Post-op Assessment: Report given to PACU RN and Post -op Vital signs reviewed and stable  Post vital signs: Reviewed and stable  Complications: No apparent anesthesia complications

## 2013-09-23 NOTE — Op Note (Signed)
Gregory Dickson, Gregory Dickson                   ACCOUNT NO.:  [id number]  MEDICAL RECORD NO.:  [id number]  LOCATION:  [facility]                         FACILITY:  [facility]  PHYSICIAN:  Georges Lynch. [name], M.D.DATE OF BIRTH:  1960/06/06  DATE OF PROCEDURE:  09/23/2013 DATE OF DISCHARGE:                              OPERATIVE REPORT   SURGEON:  Georges Lynch. Darrelyn Hillock, M.D.  ASSISTANT:  Jene Every, M.D.  PREOPERATIVE DIAGNOSES: 1. Previous complete decompressive lumbar laminectomy at L4-5. 2. Spinal stenosis at L3-4. 3. Herniated disk at L3-4 with left leg pain only.  POSTOPERATIVE DIAGNOSES: 1. Previous complete decompressive lumbar laminectomy at L4-5. 2. Spinal stenosis at L3-4. 3. Herniated disk at L3-4 with left leg pain only.  OPERATION: 1. Complete decompressive lumbar laminectomy at L3-L4 for spinal     stenosis. 2. Foraminotomies for the L3 root. 3. Foraminotomies for the L4 root, both were on the left side. 4. Microdiskectomy at L3-4 on the left.  DESCRIPTION OF PROCEDURE:  Under general anesthesia, the patient on spinal frame, routine orthopedic prep and draping of low back carried out.  The appropriate time-out was first carried out.  I also marked the appropriate left side of his back in the holding area even though we went central.  All his pain was on the left.  At this time, after the time-out as I mentioned, 2 needles were placed in the back for localization purposes and x-ray was taken.  Following that, the 2 needles were removed and we then made an incision over the L3-4 space in the usual fashion.  Bleeders were identified and cauterized.  The muscle was stripped from the lamina and spinous processes bilaterally.  Another x-ray was taken with a Kocher clamp on the spinous process of L3.  I then removed the spinous process of L3 and did a complete central decompressive lumbar laminectomy at L3-4.  We went down and removed some of the scar tissue from the level below.  We did a  partial facetectomy at L3-4 on the left.  Following that, went up under the facet, decompressed the entire lateral recess after decompressing the patient's centrally.  We exposed the L4 root at this level.  The root was quite compressed.  We cauterized lateral recess veins after we decompressed the lateral recess.  The microscope was used.  We then identified the L3- 4 disk space.  We had a large herniated disk.  We then made a cruciate incision in the posterior longitudinal ligament and then carried out our microdiskectomy.  When this was completed, we were able to easily pass a hockey-stick out the foramina of the L4 to L3 roots.  Also proximally and distally was now opened free.  We thoroughly irrigated out the area. Good hemostasis was maintained.  An x-ray was taken with the instrument in the L3-4 space.  I then loosely applied some thrombin-soaked Gelfoam over the dura.  The wound then was closed in layers in usual fashion except I left a small distal deep and proximal part of the wound open for drainage purposes.  Subcu was closed with #1 Vicryl, skin with metal staples.  Note at the beginning of the  procedure, I injected 20 mL of 0.25% Marcaine with epinephrine into the soft tissue to control the bleeding. At the end of the procedure, I injected 15 mL of Exparel into the soft tissue.  Sterile dressings were applied.  He had 2 g of IV Ancef preop. We then elected to give him a gram of vancomycin since he was positive for MRSA and Staphylococcus.          ______________________________ Georges Lynchonald A. Darrelyn HillockGioffre, M.D.     RAG/MEDQ  D:  09/23/2013  T:  09/23/2013  Job:  [id number]

## 2013-09-24 ENCOUNTER — Encounter (HOSPITAL_COMMUNITY): Payer: Self-pay | Admitting: Orthopedic Surgery

## 2013-09-24 DIAGNOSIS — M5126 Other intervertebral disc displacement, lumbar region: Secondary | ICD-10-CM | POA: Diagnosis not present

## 2013-09-24 MED ORDER — BISACODYL 10 MG RE SUPP: 10.0000 mg | Freq: Every day | RECTAL | Status: DC | PRN

## 2013-09-24 MED ORDER — DSS 100 MG PO CAPS: 100.0000 mg | ORAL_CAPSULE | Freq: Two times a day (BID) | ORAL | Status: AC

## 2013-09-24 MED ORDER — OXYCODONE-ACETAMINOPHEN 5-325 MG PO TABS
1.0000 | ORAL_TABLET | ORAL | Status: AC | PRN
Start: 2013-09-24 — End: ?

## 2013-09-24 MED ORDER — METHOCARBAMOL 500 MG PO TABS: 500.0000 mg | ORAL_TABLET | Freq: Four times a day (QID) | ORAL | Status: AC | PRN

## 2013-09-24 NOTE — Progress Notes (Signed)
UR completed 

## 2013-09-24 NOTE — Discharge Instructions (Signed)
Change your dressing daily. °Shower only, no tub bath. °Call if any temperatures greater than 101 or any wound complications: 545-5000 during the day and ask for Dr. Gioffre's nurse, Tammy Johnson. °

## 2013-09-24 NOTE — Care Management Note (Signed)
    Page 1 of 1   09/24/2013     3:14:28 PM CARE MANAGEMENT NOTE 09/24/2013  Patient:  Gregory Dickson,Gregory Dickson   Account Number:  [id number]  Date Initiated:  09/24/2013  Documentation initiated by:  [facility], LLCJEFFRIES,Gurshaan Matsuoka  Subjective/Objective Assessment:   adm: CENTRAL DECOMPRESSIVE L3-4 for L3 ROOT, L4 ROOT LEFT L3-4 MICRODISCECTOMY LEFT (N/A)     Action/Plan:   discharge planning   Anticipated DC Date:  09/25/2013   Anticipated DC Plan:  HOME/SELF CARE         Choice offered to / List presented to:             Status of service:  Completed, signed off Medicare Important Message given?  NA - LOS <3 / Initial given by admissions (If response is "NO", the following Medicare IM given date fields will be blank) Date Medicare IM given:   Medicare IM given by:   Date Additional Medicare IM given:   Additional Medicare IM given by:    Discharge Disposition:  HOME/SELF CARE  Per UR Regulation:    If discussed at Long Length of Stay Meetings, dates discussed:    Comments:  09/24/13 15:10 Cm notes PT eval recc no HH or DME.  Pt Self Care.  No CM needs were communicated.  Freddy JakschSarah Elzia Hott, BSN, CM [phone].

## 2013-09-24 NOTE — Progress Notes (Signed)
09/24/13 1500  PT Visit Information  Last PT Received On 09/24/13  Assistance Needed +1  History of Present Illness pt is s/p L2-3 decompression.  He has a h/o L4-5  PT Time Calculation  PT Start Time 1349  PT Stop Time 1405  PT Time Calculation (min) 16 min  Subjective Data  Subjective let's try  Patient Stated Goal get rid of pain  Precautions  Precautions Fall;Back  Precaution Comments reviewed back precautions  Pain Assessment  Pain Assessment 0-10  Pain Score 6  Pain Location back, knees  Pain Intervention(s) RN gave pain meds during session;Repositioned;Monitored during session  Cognition  Arousal/Alertness Awake/alert  Behavior During Therapy WFL for tasks assessed/performed  Overall Cognitive Status Within Functional Limits for tasks assessed  Bed Mobility  Overal bed mobility Needs Assistance  Bed Mobility Sidelying to Sit  Rolling Min guard  Sidelying to sit Min assist  General bed mobility comments cues for back precautions, assist wtih LEs  Transfers  Overall transfer level Needs assistance  Equipment used Rolling walker (2 wheeled)  Transfers Sit to/from Stand  Sit to Stand Min guard  General transfer comment cues for safety and technique, Cued also for hand placement and back precautions  Ambulation/Gait  Ambulation/Gait assistance Min guard  Ambulation Distance (Feet) 85 Feet  Assistive device Rolling walker (2 wheeled)  Gait Pattern/deviations Step-to pattern;Step-through pattern;Decreased stride length  Gait velocity decr  General Gait Details pt very guarded d/t pain, assist with turns, cues for precautions  PT - End of Session  Activity Tolerance Patient limited by pain  Patient left in chair;with call bell/phone within reach  Nurse Communication Mobility status  PT - Assessment/Plan  PT Plan Current plan remains appropriate  PT Frequency Min 6X/week  Follow Up Recommendations No PT follow up  PT equipment None recommended by PT  PT Goal  Progression  Progress towards PT goals Progressing toward goals  PT General Charges  $$ ACUTE PT VISIT 1 Procedure  PT Treatments  $Gait Training 8-22 mins

## 2013-09-24 NOTE — Progress Notes (Signed)
Subjective: 1 Day Post-Op Procedure(s) (LRB): CENTRAL DECOMPRESSIVE L3-4 for L3 ROOT, L4 ROOT LEFT L3-4 MICRODISCECTOMY LEFT (N/A) Patient reports pain as 3 on 0-10 scale.Mild Ileus. Good strength in lowers.  Some numbness in right upper but normal neurological.  Objective: Vital signs in last 24 hours: Temp:  [97.7 F (36.5 C)-100.2 F (37.9 C)] 100.2 F (37.9 C) (08/20 0610) Pulse Rate:  [46-78] 78 (08/20 0610) Resp:  [13-17] 16 (08/20 0610) BP: (113-143)/(65-89) 131/89 mmHg (08/20 0610) SpO2:  [96 %-100 %] 96 % (08/20 0610)  Intake/Output from previous day: 08/19 0701 - 08/20 0700 In: 4010 [P.O.:360; I.V.:3500; IV Piggyback:150] Out: 4725 [Urine:4625; Blood:100] Intake/Output this shift:    No results found for this basename: HGB,  in the last 72 hours No results found for this basename: WBC, RBC, HCT, PLT,  in the last 72 hours No results found for this basename: NA, K, CL, CO2, BUN, CREATININE, GLUCOSE, CALCIUM,  in the last 72 hours No results found for this basename: LABPT, INR,  in the last 72 hours  Neurologically intact Dorsiflexion/Plantar flexion intact  Assessment/Plan: 1 Day Post-Op Procedure(s) (LRB): CENTRAL DECOMPRESSIVE L3-4 for L3 ROOT, L4 ROOT LEFT L3-4 MICRODISCECTOMY LEFT (N/A) Up with therapy.May keep overnight.  Tesla Bochicchio A 09/24/2013, 7:18 AM

## 2013-09-24 NOTE — Evaluation (Signed)
Physical Therapy Evaluation Patient Details Name: Gregory Dickson MRN: [id number] DOB: 03/22/1960 Today's Date: 09/24/2013   History of Present Illness  pt is s/p L2-3 decompression.  He has a h/o L4-5  Clinical Impression  Pt will benefit from PT to address deficits below; Pt quite willing to do therapy this am, in chair since OT, however he has significant HA pain, back pain and bil knee pain, RN notified.    Follow Up Recommendations No PT follow up    Equipment Recommendations  None recommended by PT    Recommendations for Other Services       Precautions / Restrictions Precautions Precautions: Fall;Back Precaution Comments: reviewed back precautions Restrictions Weight Bearing Restrictions: No      Mobility  Bed Mobility Overal bed mobility: Needs Assistance Bed Mobility: Sit to Sidelying Rolling: Min assist   Supine to sit: Mod assist   Sit to sidelying: Min assist;Min guard General bed mobility comments: cues for back precautions, assist wtih LEs  Transfers Overall transfer level: Needs assistance Equipment used: Rolling walker (2 wheeled) Transfers: Sit to/from Stand Sit to Stand: Min assist         General transfer comment: cues for safety and technique, Cued also for hand placement and back precautions  Ambulation/Gait Ambulation/Gait assistance: Min guard Ambulation Distance (Feet): 75 Feet Assistive device: Rolling walker (2 wheeled) Gait Pattern/deviations: Step-to pattern Gait velocity: decr Gait velocity interpretation: Below normal speed for age/gender General Gait Details: pt very guarded d/t pain, assist with turns, cues for precautions  Stairs            Wheelchair Mobility    Modified Rankin (Stroke Patients Only)       Balance                                             Pertinent Vitals/Pain Pain Assessment: 0-10 Pain Score: 7  Pain Location: back and head, knees Pain Descriptors / Indicators:  Aching Pain Intervention(s): Repositioned;Patient requesting pain meds-RN notified;Limited activity within patient's tolerance    Home Living Family/patient expects to be discharged to:: Private residence Living Arrangements: Spouse/significant other Available Help at Discharge: Family;Available PRN/intermittently Type of Home: House Home Access: Stairs to enter Entrance Stairs-Rails: Right Entrance Stairs-Number of Steps: 3 Home Layout: One level Home Equipment: Environmental consultantWalker - 2 wheels Additional Comments: wife and daughter work    Prior Function Level of Independence: Independent               Higher education careers adviserHand Dominance        Extremity/Trunk Assessment   Upper Extremity Assessment: Defer to OT evaluation           Lower Extremity Assessment: Overall WFL for tasks assessed RLE Deficits / Details: reports numbness RLE       Communication   Communication: Prefers language other than English  Cognition Arousal/Alertness: Awake/alert Behavior During Therapy: WFL for tasks assessed/performed Overall Cognitive Status: Within Functional Limits for tasks assessed                      General Comments      Exercises        Assessment/Plan    PT Assessment Patient needs continued PT services  PT Diagnosis Difficulty walking   PT Problem List Decreased activity tolerance;Decreased mobility;Decreased knowledge of use of DME;Decreased knowledge of precautions;Pain  PT Treatment Interventions DME  instruction;Gait training;Functional mobility training;Therapeutic activities;Therapeutic exercise;Patient/family education;Stair training   PT Goals (Current goals can be found in the Care Plan section) Acute Rehab PT Goals Patient Stated Goal: get rid of pain PT Goal Formulation: With patient Time For Goal Achievement: 10/15/13 Potential to Achieve Goals: Good    Frequency Min 6X/week   Barriers to discharge        Co-evaluation               End of Session    Activity Tolerance: Patient tolerated treatment well Patient left: in bed;with call bell/phone within reach           Time: 0913-0937 PT Time Calculation (min): 24 min   Charges:   PT Evaluation $Initial PT Evaluation Tier I: 1 Procedure PT Treatments $Gait Training: 23-37 mins   PT G Codes:          Keyani Rigdon October 15, 2013, 9:48 AM

## 2013-09-24 NOTE — Evaluation (Signed)
Occupational Therapy Evaluation Patient Details Name: Gregory Dickson MRN: [id number] DOB: [birth year] Today's Date: 09/24/2013    History of Present Illness pt is s/p L2-3 decompression.  He has a h/o L4-5   Clinical Impression   This 53 year old man was admitted for the above surgery.  He will benefit from continued OT in acute to evaluate for DME and reinforce back precautions.  Pt was independent with adls prior to admission, and wife will help with whatever he needs.    Follow Up Recommendations  No OT follow up;Supervision/Assistance - 24 hour (initial 24/7)    Equipment Recommendations   (to be further assessed--may need 3:1; short stature)    Recommendations for Other Services       Precautions / Restrictions Precautions Precautions: Fall;Back Restrictions Weight Bearing Restrictions: No      Mobility Bed Mobility Overal bed mobility: Needs Assistance Bed Mobility: Rolling;Supine to Sit Rolling: Min assist   Supine to sit: Mod assist     General bed mobility comments: assist for trunk; cues for back precautions  Transfers Overall transfer level: Needs assistance Equipment used: Rolling walker (2 wheeled) Transfers: Sit to/from Stand Sit to Stand: Mod assist         General transfer comment: cues for safety and technique, Cued also for hand placement and back precautions    Balance                                            ADL Overall ADL's : Needs assistance/impaired     Grooming: Set up;Sitting   Upper Body Bathing: Minimal assitance;Sitting   Lower Body Bathing: Maximal assistance;Sit to/from stand   Upper Body Dressing : Minimal assistance;Sitting   Lower Body Dressing: Total assistance;Sit to/from stand   Toilet Transfer: Moderate assistance;Stand-pivot (bed to recliner)   Toileting- Clothing Manipulation and Hygiene: Moderate assistance;Sit to/from stand         General ADL Comments: reviewed back precautions and  ADLs. Gave him a handout. He was able to cross legs prior to sx.  States wife will help with whatever he needs.  Moves slowly     Vision                     Perception     Praxis      Pertinent Vitals/Pain Pain Assessment: 0-10 Pain Score: 9  Pain Location: back; head 7 Pain Descriptors / Indicators: Aching Pain Intervention(s): Repositioned;Patient requesting pain meds-RN notified;Limited activity within patient's tolerance     Hand Dominance     Extremity/Trunk Assessment Upper Extremity Assessment Upper Extremity Assessment: Overall WFL for tasks assessed (c/o numbness RUE after surgery:  resolved)           Communication Communication Communication: Prefers language other than English   Cognition Arousal/Alertness: Awake/alert Behavior During Therapy: WFL for tasks assessed/performed Overall Cognitive Status: Within Functional Limits for tasks assessed                     General Comments       Exercises       Shoulder Instructions      Home Living Family/patient expects to be discharged to:: Private residence Living Arrangements: Spouse/significant other Available Help at Discharge: Family;Available PRN/intermittently               Bathroom Shower/Tub: Walk-in shower  Bathroom Toilet: Standard         Additional Comments: wife and daughter work      Prior Functioning/Environment Level of Independence: Independent             OT Diagnosis: Generalized weakness   OT Problem List: Decreased strength;Decreased activity tolerance;Decreased knowledge of use of DME or AE;Decreased knowledge of precautions;Pain   OT Treatment/Interventions: Self-care/ADL training;DME and/or AE instruction;Patient/family education    OT Goals(Current goals can be found in the care plan section) Acute Rehab OT Goals Patient Stated Goal: get rid of pain OT Goal Formulation: With patient Time For Goal Achievement: 10/01/13 Potential to  Achieve Goals: Good ADL Goals Pt Will Transfer to Toilet: with min assist;regular height toilet;bedside commode;ambulating (vs) Pt Will Perform Toileting - Clothing Manipulation and hygiene: with min assist;sit to/from stand Pt Will Perform Tub/Shower Transfer: with min assist;ambulating;3 in 1 (vs none) Additional ADL Goal #1: pt will not need any cues for back precautions during session  OT Frequency: Min 2X/week   Barriers to D/C:            Co-evaluation              End of Session Nurse Communication: Patient requests pain meds (HA  BP 122/83)  Activity Tolerance: Patient tolerated treatment well Patient left: in chair;with call bell/phone within reach;with nursing/sitter in room   Time: 0758-0825 OT Time Calculation (min): 27 min Charges:  OT General Charges $OT Visit: 1 Procedure OT Evaluation $Initial OT Evaluation Tier I: 1 Procedure OT Treatments $Therapeutic Activity: 8-22 mins G-Codes: OT G-codes **NOT FOR INPATIENT CLASS** Functional Assessment Tool Used: clinical judgment Functional Limitation: Self care Self Care Current Status (O9629(G8987): At least 80 percent but less than 100 percent impaired, limited or restricted Self Care Goal Status (B2841(G8988): At least 20 percent but less than 40 percent impaired, limited or restricted  [facility] HospitalENCER,Veneta Sliter 09/24/2013, 8:55 AM  Marica OtterMaryellen Casey Fye, OTR/L [phone] 09/24/2013

## 2013-09-25 DIAGNOSIS — M5126 Other intervertebral disc displacement, lumbar region: Secondary | ICD-10-CM | POA: Diagnosis not present

## 2013-09-25 NOTE — Progress Notes (Signed)
Notified Marriottmber Constable PA about pt elevated temp of 101.3. Tylenol given. Encourage use of incentive spirometer. Will continue to monitor.

## 2013-09-25 NOTE — Progress Notes (Signed)
Occupational Therapy Treatment Patient Details Name: Gregory Dickson MRN: [id number] DOB: 1961/01/15 Today's Date: 09/25/2013    History of present illness pt is s/p L2-3 decompression.  He has a h/o L4-5   OT comments  Pt doing much better today. Assist with bed mobility but did not need cues for back precautions.  Reviewed with him.    Follow Up Recommendations    no follow up OT   Equipment Recommendations    none by OT   Recommendations for Other Services      Precautions / Restrictions Precautions Precautions: Fall;Back Precaution Comments: reviewed back precautions       Mobility Bed Mobility     Rolling: Min guard Sidelying to sit: Min assist       General bed mobility comments: cues for back precautions  Transfers     Transfers: Sit to/from Stand Sit to Stand: Supervision         General transfer comment: cues for hand placement    Balance                                   ADL                           Toilet Transfer: Min guard;Comfort height toilet;Ambulation       Tub/ Shower Transfer: Walk-in shower;Min guard;Ambulation     General ADL Comments: reviewed precautions with regards to ADLs and practiced transfers.  Pt states wife already helped him get cleaned up.  He wants disposable scrubs to go home in as he feels his pants will hurt him.  Pt has standard commode--doesn't want 3:1.  Educated on using RW vs hands on thighs to sit and rise      Vision                     Perception     Praxis      Cognition   Behavior During Therapy: [facility] for tasks assessed/performed Overall Cognitive Status: Within Functional Limits for tasks assessed                       Extremity/Trunk Assessment               Exercises     Shoulder Instructions       General Comments      Pertinent Vitals/ Pain       Pain Assessment: 0-10 Pain Score: 3  Pain Location: back Pain Intervention(s):  Premedicated before session;Repositioned  Home Living                                          Prior Functioning/Environment              Frequency       Progress Toward Goals  OT Goals(current goals can now be found in the care plan section)  Progress towards OT goals: Goals met/education completed, patient discharged from OT     Plan      Co-evaluation                 End of Session     Activity Tolerance Patient tolerated treatment well   Patient Left in chair;with call bell/phone within reach;with family/visitor present  Nurse Communication  (pt wants to d/c home as early as possible: wife has to work)        Time: 2683-4196 OT Time Calculation (min): 14 min  Charges: OT General Charges $OT Visit: 1 Procedure OT Treatments $Self Care/Home Management : 8-22 mins  Romie Keeble 09/25/2013, 8:36 AM   Lesle Chris, OTR/L [phone] 09/25/2013

## 2013-09-25 NOTE — Progress Notes (Signed)
Subjective: 2 Days Post-Op Procedure(s) (LRB): CENTRAL DECOMPRESSIVE L3-4 for L3 ROOT, L4 ROOT LEFT L3-4 MICRODISCECTOMY LEFT (N/A) Patient reports pain as 3 on 0-10 scale. Back pain only. His leg pain is absent. He is passing gas and is much better in regards to his Ileus.   Objective: Vital signs in last 24 hours: Temp:  [98.9 F (37.2 C)-101.3 F (38.5 C)] 100 F (37.8 C) (08/21 0554) Pulse Rate:  [64-83] 78 (08/21 0554) Resp:  [18-19] 18 (08/21 0554) BP: (120-126)/(67-74) 123/67 mmHg (08/21 0554) SpO2:  [95 %-99 %] 97 % (08/21 0554)  Intake/Output from previous day: 08/20 0701 - 08/21 0700 In: 1520 [P.O.:720; I.V.:800] Out: 3625 [Urine:3625] Intake/Output this shift:    No results found for this basename: HGB,  in the last 72 hours No results found for this basename: WBC, RBC, HCT, PLT,  in the last 72 hours No results found for this basename: NA, K, CL, CO2, BUN, CREATININE, GLUCOSE, CALCIUM,  in the last 72 hours No results found for this basename: LABPT, INR,  in the last 72 hours  Neurologically intact  Assessment/Plan: 2 Days Post-Op Procedure(s) (LRB): CENTRAL DECOMPRESSIVE L3-4 for L3 ROOT, L4 ROOT LEFT L3-4 MICRODISCECTOMY LEFT (N/A) Discharge home with home health  Burris Matherne A 09/25/2013, 7:31 AM

## 2013-09-28 NOTE — Discharge Summary (Signed)
Physician Discharge Summary   Patient ID: Gregory Dickson MRN: [id number] DOB/AGE: May 24, 1960 53 y.o.  Admit date: 09/23/2013 Discharge date: 09/25/2013  Primary Diagnosis: Spinal stenosis, lumbar spine    Lumbar disc herniation   Admission Diagnoses:  Past Medical History  Diagnosis Date  . History of kidney stones     multiple  . Back pain     past hx. back surgery, recent Xray done 07-20-13  . Spinal stenosis    Discharge Diagnoses:   Active Problems:   Spinal stenosis, lumbar region, with neurogenic claudication   Herniated lumbar intervertebral disc  Estimated body mass index is 28.32 kg/(m^2) as calculated from the following:   Height as of this encounter: 5' (1.524 m).   Weight as of this encounter: 65.772 kg (145 lb).  Procedure:  Procedure(s) (LRB): CENTRAL DECOMPRESSIVE L3-4 for L3 ROOT, L4 ROOT LEFT L3-4 MICRODISCECTOMY LEFT (N/A)   Consults: None  HPI: [name] is a 53 year old male who presented with the chief complaint of low back pain with radiation of pain into his LE. He has a history of lumbar decompression L4-L5 with microdiscectomy at L4-L5 on the left nearly four years ago. He has had increased back pain over the past last couple months. No change in activity or injury. No changes in bladder or bowel function other than some issues with kidney stones which has required lithotripsy. He has not demonstrated any neurological deficits in the LE. MRI and CT myelogram of the lumbar spine shows lumbar spinal stenosis L3-L4 with disc herniation to the left at L4-L5.   Laboratory Data: Hospital Outpatient Visit on 09/17/2013  Component Date Value Ref Range Status  . aPTT 09/17/2013 34  24 - 37 seconds Final  . Sodium 09/17/2013 141  137 - 147 mEq/L Final  . Potassium 09/17/2013 4.8  3.7 - 5.3 mEq/L Final  . Chloride 09/17/2013 102  96 - 112 mEq/L Final  . CO2 09/17/2013 29  19 - 32 mEq/L Final  . Glucose, Bld 09/17/2013 78  70 - 99 mg/dL Final  . BUN 09/17/2013 14  6 - 23  mg/dL Final  . Creatinine, Ser 09/17/2013 1.09  0.50 - 1.35 mg/dL Final  . Calcium 09/17/2013 10.6* 8.4 - 10.5 mg/dL Final  . Total Protein 09/17/2013 7.7  6.0 - 8.3 g/dL Final  . Albumin 09/17/2013 4.1  3.5 - 5.2 g/dL Final  . AST 09/17/2013 29  0 - 37 U/L Final   Comment: SLIGHT HEMOLYSIS                          HEMOLYSIS AT THIS LEVEL MAY AFFECT RESULT  . ALT 09/17/2013 25  0 - 53 U/L Final  . Alkaline Phosphatase 09/17/2013 109  39 - 117 U/L Final  . Total Bilirubin 09/17/2013 0.3  0.3 - 1.2 mg/dL Final  . GFR calc non Af Amer 09/17/2013 76* >90 mL/min Final  . GFR calc Af Amer 09/17/2013 88* >90 mL/min Final   Comment: (NOTE)                          The eGFR has been calculated using the CKD EPI equation.                          This calculation has not been validated in all clinical situations.  eGFR's persistently <90 mL/min signify possible Chronic Kidney                          Disease.  . Anion gap 09/17/2013 10  5 - 15 Final  . Prothrombin Time 09/17/2013 12.5  11.6 - 15.2 seconds Final  . INR 09/17/2013 0.93  0.00 - 1.49 Final  . ABO/RH(D) 09/17/2013 B POS   Final  . Antibody Screen 09/17/2013 NEG   Final  . Sample Expiration 09/17/2013 09/26/2013   Final  . Color, Urine 09/17/2013 YELLOW  YELLOW Final  . APPearance 09/17/2013 CLEAR  CLEAR Final  . Specific Gravity, Urine 09/17/2013 1.019  1.005 - 1.030 Final  . pH 09/17/2013 5.5  5.0 - 8.0 Final  . Glucose, UA 09/17/2013 NEGATIVE  NEGATIVE mg/dL Final  . Hgb urine dipstick 09/17/2013 NEGATIVE  NEGATIVE Final  . Bilirubin Urine 09/17/2013 NEGATIVE  NEGATIVE Final  . Ketones, ur 09/17/2013 NEGATIVE  NEGATIVE mg/dL Final  . Protein, ur 09/17/2013 NEGATIVE  NEGATIVE mg/dL Final  . Urobilinogen, UA 09/17/2013 0.2  0.0 - 1.0 mg/dL Final  . Nitrite 09/17/2013 NEGATIVE  NEGATIVE Final  . Leukocytes, UA 09/17/2013 NEGATIVE  NEGATIVE Final   MICROSCOPIC NOT DONE ON URINES WITH NEGATIVE PROTEIN,  BLOOD, LEUKOCYTES, NITRITE, OR GLUCOSE <1000 mg/dL.  Marland Kitchen MRSA, PCR 09/17/2013 POSITIVE* NEGATIVE Final   Comment: RESULT CALLED TO, READ BACK BY AND VERIFIED WITH:                          AFTER HOURS @ 1950 ON 8.13.15 BY MCCOY, N.  . Staphylococcus aureus 09/17/2013 POSITIVE* NEGATIVE Final   Comment:                                 The Xpert SA Assay (FDA                          approved for NASAL specimens                          in patients over 33 years of age),                          is one component of                          a comprehensive surveillance                          program.  Test performance has                          been validated by [employer] for patients greater                          than or equal to 108 year old.                          It is not intended  to diagnose infection nor to                          guide or monitor treatment.                          RESULT CALLED TO, READ BACK BY AND VERIFIED WITH:                          AFTER HOURS @ 1950 ON 8.13.15 BY MCCOY,N.  . WBC 09/17/2013 5.2  4.0 - 10.5 K/uL Final  . RBC 09/17/2013 5.49  4.22 - 5.81 MIL/uL Final  . Hemoglobin 09/17/2013 15.5  13.0 - 17.0 g/dL Final  . HCT 09/17/2013 46.3  39.0 - 52.0 % Final  . MCV 09/17/2013 84.3  78.0 - 100.0 fL Final  . MCH 09/17/2013 28.2  26.0 - 34.0 pg Final  . MCHC 09/17/2013 33.5  30.0 - 36.0 g/dL Final  . RDW 09/17/2013 13.1  11.5 - 15.5 % Final  . Platelets 09/17/2013 188  150 - 400 K/uL Final  Admission on 08/12/2013, Discharged on 08/12/2013  Component Date Value Ref Range Status  . Sodium 08/12/2013 140  137 - 147 mEq/L Final  . Potassium 08/12/2013 4.1  3.7 - 5.3 mEq/L Final  . Glucose, Bld 08/12/2013 106* 70 - 99 mg/dL Final  . HCT 08/12/2013 46.0  39.0 - 52.0 % Final  . Hemoglobin 08/12/2013 15.6  13.0 - 17.0 g/dL Final     X-Rays:Dg Chest 2 View  09/17/2013   CLINICAL DATA:  Preop for  lumbar spine surgery  EXAM: CHEST  2 VIEW  COMPARISON:  None.  FINDINGS: The lungs are not optimally aerated. However no focal infiltrate or effusion is seen. Mediastinal and hilar contours are within normal limits. The heart is borderline enlarged. There are degenerative changes in the lower thoracic spine. Chronic changes are noted in the right shoulder.  IMPRESSION: No active cardiopulmonary disease.   Electronically Signed   By: Ivar Drape M.D.   On: 09/17/2013 14:56   Dg Lumbar Spine 2-3 Views  09/17/2013   CLINICAL DATA:  Preop for lumbar spine surgery  EXAM: LUMBAR SPINE - 2-3 VIEW  COMPARISON:  CT abdomen pelvis of 07/21/2013  FINDINGS: The lumbar vertebrae are minimally straightened in alignment. There is mild degenerative disc disease at L4-5 with slight loss of disc space and irregular endplates. The remainder of the intervertebral disc spaces appear normal. There appears to be prior laminectomy performed at L4 level. The SI joints are corticated.  IMPRESSION: Minimally straightened alignment of the lumbar vertebrae. Mild degenerative disc disease at L4-5.   Electronically Signed   By: Ivar Drape M.D.   On: 09/17/2013 14:59   Dg Spine Portable 1 View  09/23/2013   CLINICAL DATA:  Back surgery  EXAM: PORTABLE SPINE - 1 VIEW  COMPARISON:  09/23/2013  FINDINGS: Surgical probe overlies the L3-4 disc space.  Mild disc space narrowing at L3-4 and L4-5. Normal alignment. L4 laminectomy  IMPRESSION: L3-4 disc space localized.   Electronically Signed   By: Franchot Gallo M.D.   On: 09/23/2013 10:02   Dg Spine Portable 1 View  09/23/2013   CLINICAL DATA:  Intraoperative image number 2  EXAM: PORTABLE SPINE - 1 VIEW  COMPARISON:  09/17/2013  FINDINGS: A surgical instrument projects over the top of the L3 spinous process.  IMPRESSION:  Intraoperative marking of the L3 spinous process.   Electronically Signed   By: Maryclare Bean M.D.   On: 09/23/2013 09:32   Dg Spine Portable 1 View  09/23/2013   CLINICAL  DATA:  Spine surgery.  EXAM: PORTABLE SPINE - 1 VIEW  COMPARISON:  09/17/2013.  FINDINGS: Lumbar are numbered as per prior lumbar spine series. The lowest segmented appearing lumbar vertebrae is labeled L5. Prior laminectomy L4. Metallic markers are noted at the L3-L4 and L4-L5 disc space levels.  IMPRESSION: Metallic markers are noted at the L3-L4 and L4-L5 disc space levels.   Electronically Signed   By: Marcello Moores  Register   On: 09/23/2013 09:05    EKG: Orders placed during the hospital encounter of 09/17/13  . EKG 12-LEAD  . EKG 12-LEAD     Hospital Course: Gregory Dickson is a 53 y.o. who was admitted to [facility]. They were brought to the operating room on 09/23/2013 and underwent Procedure(s): CENTRAL DECOMPRESSIVE L3-4 for L3 ROOT, L4 ROOT LEFT L3-4 MICRODISCECTOMY LEFT.  Patient tolerated the procedure well and was later transferred to the recovery room and then to the orthopaedic floor for postoperative care.  They were given PO and IV analgesics for pain control following their surgery.  They were given 24 hours of postoperative antibiotics of  Anti-infectives   Start     Dose/Rate Route Frequency Ordered Stop   09/23/13 2100  vancomycin (VANCOCIN) IVPB 1000 mg/200 mL premix     1,000 mg 200 mL/hr over 60 Minutes Intravenous  Once 09/23/13 1208 09/23/13 2219   09/23/13 1000  vancomycin (VANCOCIN) 1,000 mg in sodium chloride 0.9 % 250 mL IVPB  Status:  Discontinued     1,000 mg 250 mL/hr over 60 Minutes Intravenous Every 12 hours 09/23/13 0853 09/23/13 1005   09/23/13 0933  polymyxin B 500,000 Units, bacitracin 50,000 Units in sodium chloride irrigation 0.9 % 500 mL irrigation  Status:  Discontinued       As needed 09/23/13 0934 09/23/13 1024   09/23/13 0631  ceFAZolin (ANCEF) IVPB 2 g/50 mL premix     2 g 100 mL/hr over 30 Minutes Intravenous On call to O.R. 09/23/13 1517 09/23/13 0850     and started on DVT prophylaxis in the form of Aspirin.   PT was ordered for ambulation  assistance.  Discharge planning consulted to help with postop disposition and equipment needs.  Patient had a difficult night on the evening of surgery, developing a mild ileus. He was placed on a clear liquid diet and showed improvement post op day two with positive flatus.  Patient was seen in rounds and was ready to go home.   Diet: Regular diet Activity:WBAT Follow-up:in 2 weeks Disposition - Home Discharged Condition: stable   Discharge Instructions   Call MD / Call 911    Complete by:  As directed   If you experience chest pain or shortness of breath, CALL 911 and be transported to the hospital emergency room.  If you develope a fever above 101 F, pus (white drainage) or increased drainage or redness at the wound, or calf pain, call your surgeon's office.     Constipation Prevention    Complete by:  As directed   Drink plenty of fluids.  Prune juice may be helpful.  You may use a stool softener, such as Colace (over the counter) 100 mg twice a day.  Use MiraLax (over the counter) for constipation as needed.     Diet general  Complete by:  As directed      Discharge instructions    Complete by:  As directed   Change your dressing daily. Shower only, no tub bath. Call if any temperatures greater than 101 or any wound complications: [phone] during the day and ask for Dr. Charlestine Night nurse, Brunilda Payor.     Driving restrictions    Complete by:  As directed   No driving for 2 weeks     Increase activity slowly as tolerated    Complete by:  As directed      Lifting restrictions    Complete by:  As directed   No lifting            Medication List         DSS 100 MG Caps  Take 100 mg by mouth 2 (two) times daily.     methocarbamol 500 MG tablet  Commonly known as:  ROBAXIN  Take 1 tablet (500 mg total) by mouth every 6 (six) hours as needed for muscle spasms.     oxyCODONE-acetaminophen 5-325 MG per tablet  Commonly known as:  PERCOCET  Take 1-2 tablets by mouth every  4 (four) hours as needed.           Follow-up Information   Follow up with GIOFFRE,RONALD A, MD. Schedule an appointment as soon as possible for a visit in 2 weeks.   Specialty:  Orthopedic Surgery   Contact information:   [address] [place] [address] [phone]       Signed: Ardeen Jourdain, PA-C Orthopaedic Surgery 09/28/2013, 8:58 AM

## 2016-05-11 IMAGING — DX DG SPINE 1V PORT
1 series · 1 of 1 positions shown · non-contrast
Comparison: 09/17/2013.

CLINICAL DATA: Spine surgery.

EXAM:
PORTABLE SPINE - 1 VIEW

[l-spine x-table]
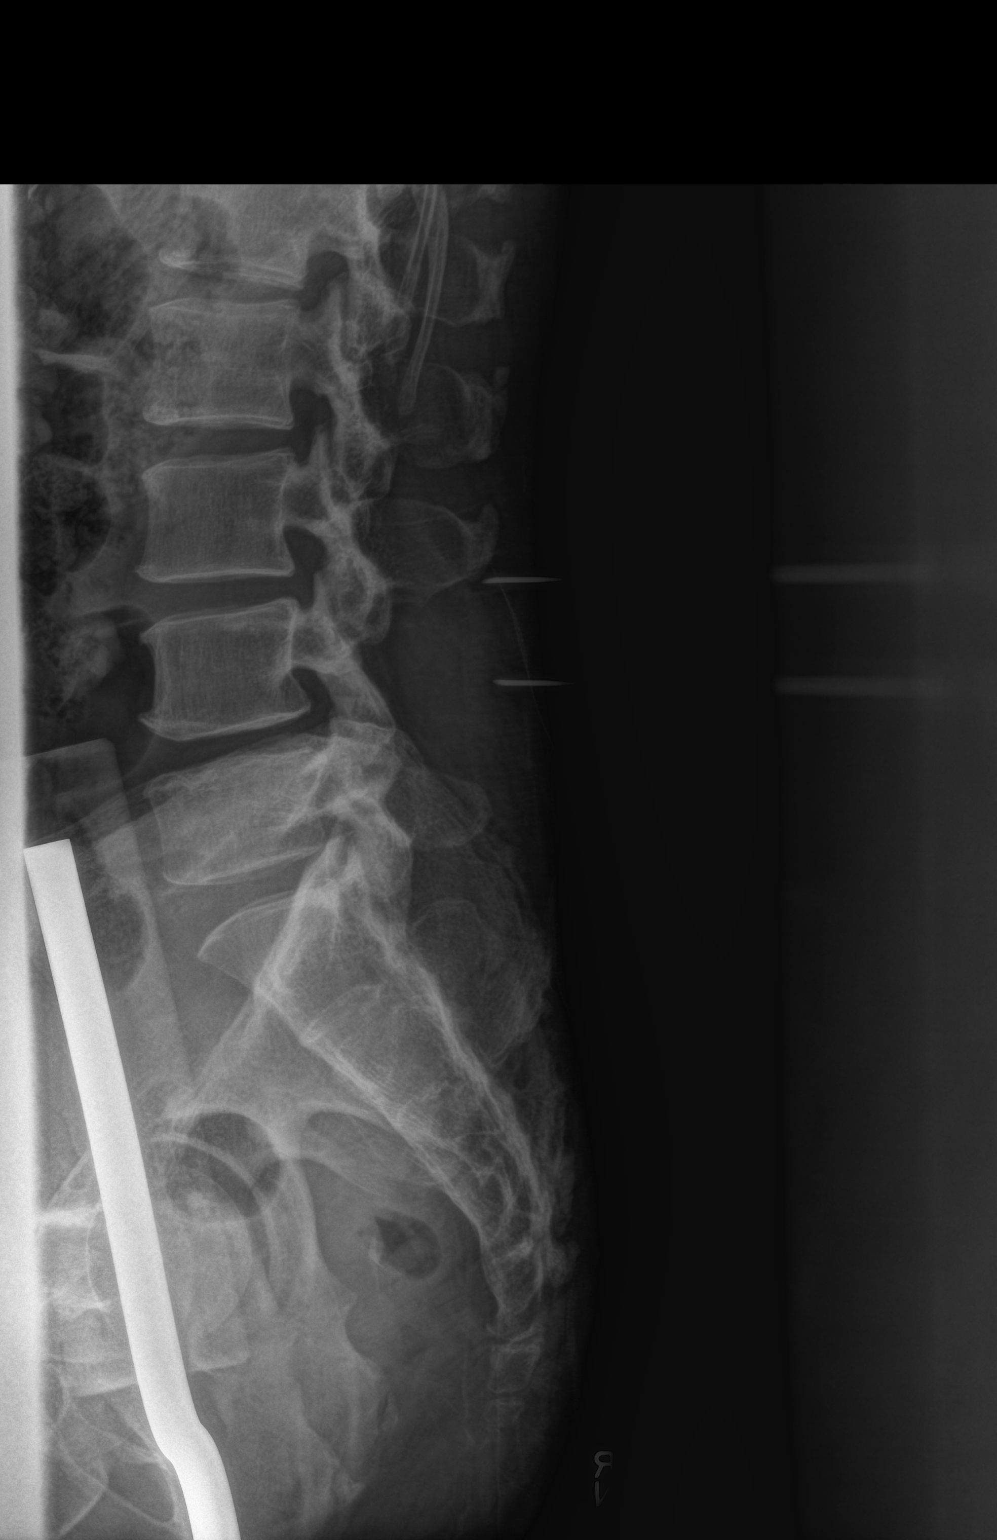

[1 of 1 positions shown; findings below may reference images not displayed]

FINDINGS: Lumbar are numbered as per prior lumbar spine series. The lowest
segmented appearing lumbar vertebrae is labeled L5. Prior
laminectomy L4. Metallic markers are noted at the L3-L4 and L4-L5
disc space levels.
IMPRESSION: Metallic markers are noted at the L3-L4 and L4-L5 disc space levels.

## 2016-05-11 IMAGING — DX DG SPINE 1V PORT
1 series · 1 of 1 positions shown · non-contrast
Comparison: 09/23/2013

CLINICAL DATA: Back surgery

EXAM:
PORTABLE SPINE - 1 VIEW

[l-spine x-table]
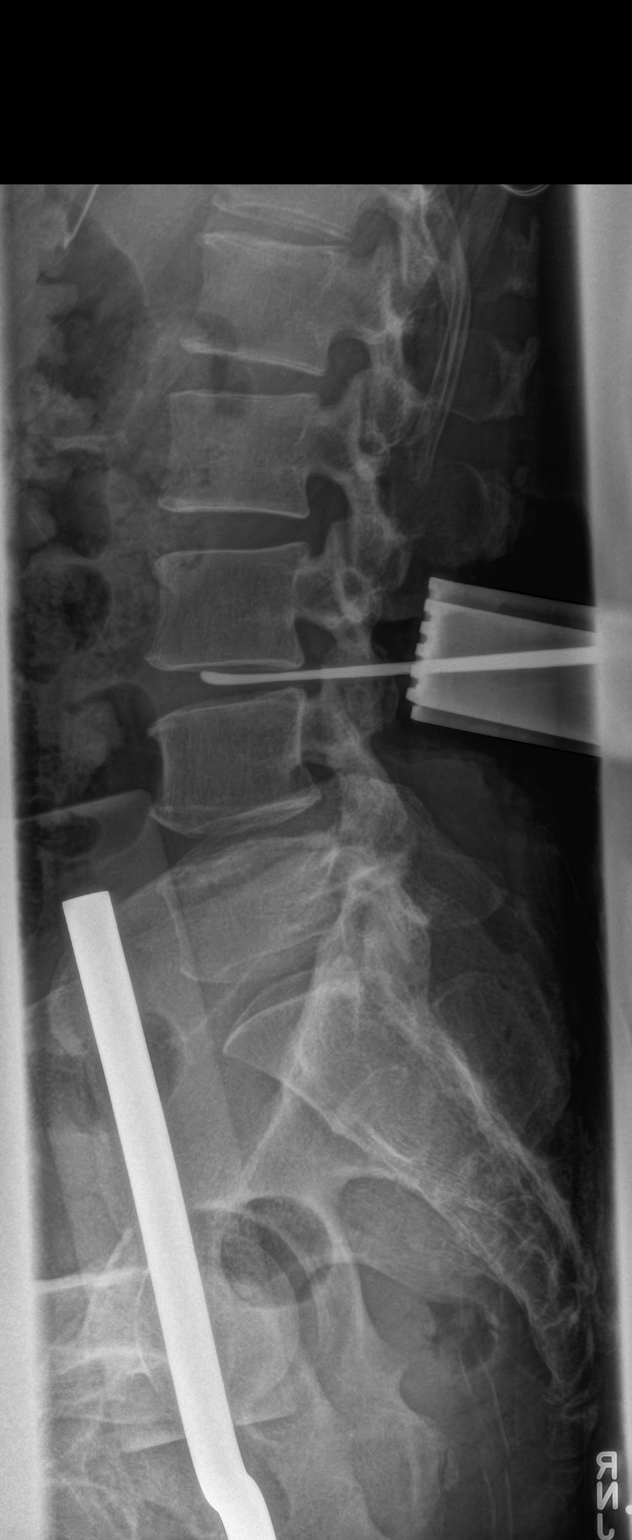

[1 of 1 positions shown; findings below may reference images not displayed]

FINDINGS: Surgical probe overlies the L3-4 disc space.

Mild disc space narrowing at L3-4 and L4-5. Normal alignment. L4
laminectomy
IMPRESSION: L3-4 disc space localized.
